# Patient Record
Sex: Male | Born: 1960 | Race: White | Hispanic: No | Marital: Married | Smoking: Never smoker
Health system: Southern US, Community
[De-identification: ages and names within clinical notes are randomized; demographics above are authoritative.]

## PROBLEM LIST (undated history)

## (undated) DIAGNOSIS — G4730 Sleep apnea, unspecified: Secondary | ICD-10-CM

## (undated) DIAGNOSIS — I779 Disorder of arteries and arterioles, unspecified: Secondary | ICD-10-CM

## (undated) DIAGNOSIS — I2699 Other pulmonary embolism without acute cor pulmonale: Secondary | ICD-10-CM

## (undated) DIAGNOSIS — R931 Abnormal findings on diagnostic imaging of heart and coronary circulation: Secondary | ICD-10-CM

## (undated) DIAGNOSIS — K5792 Diverticulitis of intestine, part unspecified, without perforation or abscess without bleeding: Secondary | ICD-10-CM

## (undated) DIAGNOSIS — M109 Gout, unspecified: Secondary | ICD-10-CM

## (undated) DIAGNOSIS — N289 Disorder of kidney and ureter, unspecified: Secondary | ICD-10-CM

## (undated) DIAGNOSIS — I10 Essential (primary) hypertension: Secondary | ICD-10-CM

## (undated) HISTORY — PX: TONSILLECTOMY: SUR1361

## (undated) HISTORY — DX: Abnormal findings on diagnostic imaging of heart and coronary circulation: R93.1

## (undated) HISTORY — DX: Essential (primary) hypertension: I10

## (undated) HISTORY — DX: Disorder of arteries and arterioles, unspecified: I77.9

---

## 2001-05-05 ENCOUNTER — Ambulatory Visit (HOSPITAL_COMMUNITY): Admission: RE | Admit: 2001-05-05 | Discharge: 2001-05-05 | Payer: Self-pay | Admitting: Cardiology

## 2013-06-28 ENCOUNTER — Ambulatory Visit
Admission: RE | Admit: 2013-06-28 | Discharge: 2013-06-28 | Disposition: A | Discharge status: Home / Self Care | Payer: BC Managed Care – PPO | Source: Ambulatory Visit | Attending: Family Medicine | Admitting: Family Medicine

## 2013-06-28 ENCOUNTER — Other Ambulatory Visit: Payer: Self-pay | Admitting: Family Medicine

## 2013-06-28 DIAGNOSIS — R110 Nausea: Secondary | ICD-10-CM

## 2013-06-30 ENCOUNTER — Inpatient Hospital Stay (HOSPITAL_COMMUNITY)
Admission: AD | Admit: 2013-06-30 | Discharge: 2013-07-07 | DRG: 556 | Disposition: A | Discharge status: Home / Self Care | Payer: BC Managed Care – PPO | Source: Ambulatory Visit | Attending: Surgery | Admitting: Surgery

## 2013-06-30 ENCOUNTER — Other Ambulatory Visit: Payer: Self-pay

## 2013-06-30 ENCOUNTER — Other Ambulatory Visit (HOSPITAL_COMMUNITY): Payer: Self-pay | Admitting: Family Medicine

## 2013-06-30 ENCOUNTER — Encounter (INDEPENDENT_AMBULATORY_CARE_PROVIDER_SITE_OTHER): Payer: Self-pay | Admitting: General Surgery

## 2013-06-30 ENCOUNTER — Ambulatory Visit (INDEPENDENT_AMBULATORY_CARE_PROVIDER_SITE_OTHER): Payer: Self-pay | Admitting: General Surgery

## 2013-06-30 ENCOUNTER — Encounter (HOSPITAL_COMMUNITY): Payer: Self-pay | Admitting: Neurology

## 2013-06-30 VITALS — BP 136/82 | HR 80 | Resp 18 | Ht 71.0 in | Wt 237.0 lb

## 2013-06-30 DIAGNOSIS — I2699 Other pulmonary embolism without acute cor pulmonale: Secondary | ICD-10-CM

## 2013-06-30 DIAGNOSIS — K8000 Calculus of gallbladder with acute cholecystitis without obstruction: Principal | ICD-10-CM | POA: Diagnosis present

## 2013-06-30 DIAGNOSIS — G4733 Obstructive sleep apnea (adult) (pediatric): Secondary | ICD-10-CM | POA: Diagnosis present

## 2013-06-30 DIAGNOSIS — K429 Umbilical hernia without obstruction or gangrene: Secondary | ICD-10-CM | POA: Diagnosis present

## 2013-06-30 DIAGNOSIS — K8020 Calculus of gallbladder without cholecystitis without obstruction: Secondary | ICD-10-CM

## 2013-06-30 DIAGNOSIS — R42 Dizziness and giddiness: Secondary | ICD-10-CM | POA: Diagnosis not present

## 2013-06-30 DIAGNOSIS — D72829 Elevated white blood cell count, unspecified: Secondary | ICD-10-CM | POA: Diagnosis present

## 2013-06-30 DIAGNOSIS — N289 Disorder of kidney and ureter, unspecified: Secondary | ICD-10-CM | POA: Diagnosis present

## 2013-06-30 DIAGNOSIS — I951 Orthostatic hypotension: Secondary | ICD-10-CM | POA: Diagnosis not present

## 2013-06-30 DIAGNOSIS — M109 Gout, unspecified: Secondary | ICD-10-CM | POA: Diagnosis present

## 2013-06-30 DIAGNOSIS — K8010 Calculus of gallbladder with chronic cholecystitis without obstruction: Secondary | ICD-10-CM | POA: Diagnosis present

## 2013-06-30 HISTORY — DX: Gout, unspecified: M10.9

## 2013-06-30 HISTORY — DX: Sleep apnea, unspecified: G47.30

## 2013-06-30 HISTORY — DX: Disorder of kidney and ureter, unspecified: N28.9

## 2013-06-30 LAB — COMPREHENSIVE METABOLIC PANEL
ALT: 23 U/L (ref 0–53)
AST: 20 U/L (ref 0–37)
Albumin: 3.5 g/dL (ref 3.5–5.2)
Alkaline Phosphatase: 57 U/L (ref 39–117)
Chloride: 97 mEq/L (ref 96–112)
Potassium: 4.2 mEq/L (ref 3.5–5.1)
Sodium: 135 mEq/L (ref 135–145)
Total Protein: 7.1 g/dL (ref 6.0–8.3)

## 2013-06-30 LAB — CBC WITH DIFFERENTIAL/PLATELET
Basophils Relative: 0 % (ref 0–1)
Eosinophils Absolute: 0.2 10*3/uL (ref 0.0–0.7)
MCH: 30.4 pg (ref 26.0–34.0)
MCHC: 33.6 g/dL (ref 30.0–36.0)
Neutro Abs: 6 10*3/uL (ref 1.7–7.7)
Neutrophils Relative %: 69 % (ref 43–77)
Platelets: 249 10*3/uL (ref 150–400)
RBC: 4.25 MIL/uL (ref 4.22–5.81)

## 2013-06-30 MED ORDER — MORPHINE SULFATE 4 MG/ML IJ SOLN
4.0000 mg | INTRAMUSCULAR | Status: DC | PRN
  Administered 2013-06-30: 4 mg via INTRAVENOUS
  Filled 2013-06-30: qty 1

## 2013-06-30 MED ORDER — KCL IN DEXTROSE-NACL 20-5-0.9 MEQ/L-%-% IV SOLN
INTRAVENOUS | Status: DC
  Administered 2013-06-30 – 2013-07-02 (×4): via INTRAVENOUS
  Administered 2013-07-03: 100 mL via INTRAVENOUS
  Administered 2013-07-04 (×2): via INTRAVENOUS
  Filled 2013-06-30 (×15): qty 1000

## 2013-06-30 MED ORDER — PANTOPRAZOLE SODIUM 40 MG IV SOLR
40.0000 mg | Freq: Every day | INTRAVENOUS | Status: DC
  Administered 2013-06-30 – 2013-07-04 (×5): 40 mg via INTRAVENOUS
  Filled 2013-06-30 (×7): qty 40

## 2013-06-30 MED ORDER — ONDANSETRON HCL 4 MG/2ML IJ SOLN: 4.0000 mg | Freq: Four times a day (QID) | INTRAMUSCULAR | Status: DC | PRN

## 2013-06-30 MED ORDER — CIPROFLOXACIN IN D5W 400 MG/200ML IV SOLN
400.0000 mg | Freq: Two times a day (BID) | INTRAVENOUS | Status: DC
  Administered 2013-06-30 – 2013-07-02 (×4): 400 mg via INTRAVENOUS
  Filled 2013-06-30 (×4): qty 200

## 2013-06-30 MED ORDER — HYDROCODONE-ACETAMINOPHEN 5-325 MG PO TABS
1.0000 | ORAL_TABLET | ORAL | Status: DC | PRN
  Administered 2013-06-30 – 2013-07-02 (×2): 1 via ORAL
  Administered 2013-07-03 – 2013-07-04 (×3): 2 via ORAL
  Administered 2013-07-05: 1 via ORAL
  Filled 2013-06-30 (×2): qty 2
  Filled 2013-06-30: qty 1
  Filled 2013-06-30: qty 2
  Filled 2013-06-30 (×2): qty 1

## 2013-06-30 NOTE — Progress Notes (Addendum)
Spoke with patient regarding cpap, per md rx for RT consult.  Placed pt on nasal cpap at 14cm h2o per home settings.  Pt is tolerating well at this time.  RN aware.  Pt will have wife bring his nasal pillows from home should he still be here tomorrow night.  Sterile water added to max fill line of humidity chamber.

## 2013-06-30 NOTE — H&P (Signed)
Allen Whitney is an 52 y.o. male.   Chief Complaint: abdominal pain HPI: the patient is a 52 year old white male who has been experiencing severe right back pain for the last 6 days. He has had moderate right upper quadrant abdominal pain. He has had nausea with some dry heaves. The pain has been fairly persistent. He has had an ultrasound which showed stones in the gallbladder and some gallbladder wall thickening. It also showed a stone lodged in the neck of the gallbladder. There was no ductal dilatation.  History reviewed. No pertinent past medical history.  Past Surgical History  Procedure Laterality Date  . Tonsillectomy      History reviewed. No pertinent family history. Social History:  reports that he has never smoked. He does not have any smokeless tobacco history on file. He reports that he does not drink alcohol or use illicit drugs.  Allergies: Not on File   (Not in a hospital admission)  No results found for this or any previous visit (from the past 48 hour(s)). US Abdomen Complete  06/28/2013   *RADIOLOGY REPORT*  Clinical Data: Back pain with nausea constipation.  ABDOMEN ULTRASOUND  Technique:  Complete abdominal ultrasound examination was performed including evaluation of the liver, gallbladder, bile ducts, pancreas, kidneys, spleen, IVC, and abdominal aorta.  Comparison: CT scan from yesterday.  Findings:  Gallbladder:  Several wall tiny echogenic foci in the neck of the gallbladder suggests stones measuring in the six - 7 mm size range. Gallbladder wall is mildly thickened, measuring 3-4 mm.  No substantial pericholecystic fluid.  The sonographer reports no sonographic Murphy's sign.  Common Bile Duct:  Poorly visualized secondary to poor acoustic window.  Liver:  Echogenic parenchyma suggest fatty infiltration.  No evidence for intrahepatic biliary duct dilatation.  IVC:  Normal.  Pancreas:  Poorly seen secondary overlying bowel gas.  Spleen:  Probable calcified granuloma  within the parenchyma.  Right kidney:  13.3 cm in long axis.  Normal.  Left kidney:  12.1 cm in long axis.  Normal.  Abdominal Aorta:  Obscured by overlying bowel gas.  IMPRESSION: Gallstones with apparent mild gallbladder wall thickening. If the clinical picture is equivocal for acute cholecystitis, nuclear scintigraphy may prove helpful to further evaluate.   Original Report Authenticated By: Kennith Center, M.D.    Review of Systems  Constitutional: Negative.   HENT: Negative.   Eyes: Negative.   Respiratory: Negative.   Cardiovascular: Negative.   Gastrointestinal: Positive for nausea, vomiting and abdominal pain.  Genitourinary: Negative.   Musculoskeletal: Positive for back pain.  Skin: Negative.   Neurological: Negative.   Endo/Heme/Allergies: Negative.   Psychiatric/Behavioral: Negative.     Blood pressure 136/82, pulse 80, resp. rate 18, height 5\' 11"  (1.803 m), weight 237 lb (107.502 kg). Physical Exam  Constitutional: He is oriented to person, place, and time. He appears well-developed and well-nourished.  HENT:  Head: Normocephalic and atraumatic.  Eyes: Conjunctivae and EOM are normal. Pupils are equal, round, and reactive to light.  Neck: Normal range of motion. Neck supple.  Cardiovascular: Normal rate, regular rhythm and normal heart sounds.   Respiratory: Effort normal and breath sounds normal.  GI:  Moderate tenderness in RUQ. Severe right back pain  Musculoskeletal: Normal range of motion.  Neurological: He is alert and oriented to person, place, and time.  Skin: Skin is warm and dry.  Psychiatric: He has a normal mood and affect. His behavior is normal.     Assessment/Plan The patient appears to have  cholecystitis with cholelithiasis. He has a very ill and uncomfortable appearance and I do not think he would make it very many days before having to go to the emergency department. Because of his pain level I think he should be admitted to the hospital with plans to  remove his gallbladder this weekend. I've discussed with him in detail the risks and benefits of the operation to do this as well as some of the technical aspects and he understands and wishes to proceed. We will admit him to [facility] now  TOTH III,Lael Pilch S 06/30/2013, 3:33 PM

## 2013-06-30 NOTE — Progress Notes (Signed)
Subjective:     Patient ID: El Pile, male   DOB: 10/04/1961, 52 y.o.   MRN: [id number]  HPI The patient appears to have cholecystitis and cholelithiasis. [facility] with plans for surgery tomorrow  Review of Systems     Objective:   Physical Exam     Assessment:     Cholecystitis with cholelithiasis     Plan:     Plan for admission. History and physical was dictated

## 2013-06-30 NOTE — Progress Notes (Signed)
MD notified pt temp 100.6, also addressed pain not relieved by morphine, orders placed.

## 2013-07-01 ENCOUNTER — Observation Stay (HOSPITAL_COMMUNITY): Payer: BC Managed Care – PPO

## 2013-07-01 ENCOUNTER — Encounter (HOSPITAL_COMMUNITY): Admission: AD | Disposition: A | Discharge status: Home / Self Care | Payer: Self-pay | Source: Ambulatory Visit

## 2013-07-01 ENCOUNTER — Encounter (HOSPITAL_COMMUNITY): Payer: Self-pay | Admitting: Anesthesiology

## 2013-07-01 ENCOUNTER — Observation Stay (HOSPITAL_COMMUNITY): Payer: BC Managed Care – PPO | Admitting: Anesthesiology

## 2013-07-01 DIAGNOSIS — K8066 Calculus of gallbladder and bile duct with acute and chronic cholecystitis without obstruction: Secondary | ICD-10-CM

## 2013-07-01 HISTORY — PX: CHOLECYSTECTOMY: SHX55

## 2013-07-01 HISTORY — PX: UMBILICAL HERNIA REPAIR: SHX196

## 2013-07-01 SURGERY — LAPAROSCOPIC CHOLECYSTECTOMY
Anesthesia: General | Site: Abdomen | Wound class: Dirty or Infected

## 2013-07-01 SURGERY — LAPAROSCOPIC CHOLECYSTECTOMY WITH INTRAOPERATIVE CHOLANGIOGRAM
Anesthesia: General

## 2013-07-01 MED ORDER — MORPHINE SULFATE 2 MG/ML IJ SOLN
2.0000 mg | INTRAMUSCULAR | Status: DC | PRN
  Administered 2013-07-01: 4 mg via INTRAVENOUS
  Administered 2013-07-01: 2 mg via INTRAVENOUS
  Administered 2013-07-02: 4 mg via INTRAVENOUS
  Administered 2013-07-03 (×2): 2 mg via INTRAVENOUS
  Filled 2013-07-01 (×2): qty 1
  Filled 2013-07-01: qty 2
  Filled 2013-07-01 (×2): qty 1
  Filled 2013-07-01: qty 2

## 2013-07-01 MED ORDER — LACTATED RINGERS IV SOLN
INTRAVENOUS | Status: DC
Start: 2013-07-01 — End: 2013-07-01
  Administered 2013-07-01: 12:00:00 via INTRAVENOUS

## 2013-07-01 MED ORDER — METHOCARBAMOL 500 MG PO TABS
500.0000 mg | ORAL_TABLET | Freq: Three times a day (TID) | ORAL | Status: DC
  Administered 2013-07-01 – 2013-07-05 (×14): 500 mg via ORAL
  Filled 2013-07-01 (×18): qty 1

## 2013-07-01 MED ORDER — NEOSTIGMINE METHYLSULFATE 1 MG/ML IJ SOLN
INTRAMUSCULAR | Status: DC | PRN
  Administered 2013-07-01: 4 mg via INTRAVENOUS

## 2013-07-01 MED ORDER — ONDANSETRON HCL 4 MG/2ML IJ SOLN
INTRAMUSCULAR | Status: DC | PRN
  Administered 2013-07-01: 4 mg via INTRAVENOUS

## 2013-07-01 MED ORDER — PROPOFOL 10 MG/ML IV BOLUS
INTRAVENOUS | Status: DC | PRN
  Administered 2013-07-01: 200 mg via INTRAVENOUS

## 2013-07-01 MED ORDER — BUPIVACAINE HCL 0.5 % IJ SOLN
INTRAMUSCULAR | Status: DC | PRN
  Administered 2013-07-01: 15 mL

## 2013-07-01 MED ORDER — HYDROMORPHONE HCL PF 1 MG/ML IJ SOLN: 0.2500 mg | INTRAMUSCULAR | Status: DC | PRN

## 2013-07-01 MED ORDER — GLYCOPYRROLATE 0.2 MG/ML IJ SOLN
INTRAMUSCULAR | Status: DC | PRN
  Administered 2013-07-01: .7 mg via INTRAVENOUS

## 2013-07-01 MED ORDER — BUPIVACAINE HCL (PF) 0.5 % IJ SOLN
INTRAMUSCULAR | Status: AC
  Filled 2013-07-01: qty 30

## 2013-07-01 MED ORDER — LABETALOL HCL 5 MG/ML IV SOLN
INTRAVENOUS | Status: DC | PRN
  Administered 2013-07-01: 5 mg via INTRAVENOUS

## 2013-07-01 MED ORDER — PROMETHAZINE HCL 25 MG/ML IJ SOLN
12.5000 mg | INTRAMUSCULAR | Status: DC | PRN
  Administered 2013-07-01: 12.5 mg via INTRAVENOUS

## 2013-07-01 MED ORDER — LIDOCAINE HCL (CARDIAC) 20 MG/ML IV SOLN
INTRAVENOUS | Status: DC | PRN
  Administered 2013-07-01: 100 mg via INTRAVENOUS

## 2013-07-01 MED ORDER — LACTATED RINGERS IR SOLN
Status: DC | PRN
  Administered 2013-07-01: 1

## 2013-07-01 MED ORDER — MIDAZOLAM HCL 5 MG/5ML IJ SOLN
INTRAMUSCULAR | Status: DC | PRN
  Administered 2013-07-01: 2 mg via INTRAVENOUS

## 2013-07-01 MED ORDER — FENTANYL CITRATE 0.05 MG/ML IJ SOLN
INTRAMUSCULAR | Status: DC | PRN
  Administered 2013-07-01: 50 ug via INTRAVENOUS
  Administered 2013-07-01 (×2): 100 ug via INTRAVENOUS

## 2013-07-01 MED ORDER — HYDROMORPHONE HCL PF 1 MG/ML IJ SOLN
INTRAMUSCULAR | Status: DC | PRN
  Administered 2013-07-01 (×2): 0.5 mg via INTRAVENOUS

## 2013-07-01 MED ORDER — LACTATED RINGERS IV SOLN
INTRAVENOUS | Status: DC | PRN
  Administered 2013-07-01: 10:00:00 via INTRAVENOUS

## 2013-07-01 MED ORDER — ROCURONIUM BROMIDE 100 MG/10ML IV SOLN
INTRAVENOUS | Status: DC | PRN
  Administered 2013-07-01: 50 mg via INTRAVENOUS
  Administered 2013-07-01: 10 mg via INTRAVENOUS

## 2013-07-01 MED ORDER — PROMETHAZINE HCL 25 MG/ML IJ SOLN
12.5000 mg | INTRAMUSCULAR | Status: AC | PRN
  Administered 2013-07-01: 12.5 mg via INTRAVENOUS

## 2013-07-01 SURGICAL SUPPLY — 52 items
APL SKNCLS STERI-STRIP NONHPOA (GAUZE/BANDAGES/DRESSINGS) ×2
APPLIER CLIP 5 13 M/L LIGAMAX5 (MISCELLANEOUS) ×3
APPLIER CLIP ROT 13.4 12 LRG (CLIP) ×3
APR CLP LRG 13.4X12 ROT 20 MLT (CLIP) ×2
APR CLP MED LRG 5 ANG JAW (MISCELLANEOUS) ×2
BAG SPEC RTRVL LRG 6X4 10 (ENDOMECHANICALS) ×2
BENZOIN TINCTURE PRP APPL 2/3 (GAUZE/BANDAGES/DRESSINGS) ×3 IMPLANT
CANISTER SUCTION 2500CC (MISCELLANEOUS) ×3 IMPLANT
CHLORAPREP W/TINT 26ML (MISCELLANEOUS) ×3 IMPLANT
CLIP APPLIE 5 13 M/L LIGAMAX5 (MISCELLANEOUS) ×2 IMPLANT
CLIP APPLIE ROT 13.4 12 LRG (CLIP) ×1 IMPLANT
CLOTH BEACON ORANGE TIMEOUT ST (SAFETY) ×3 IMPLANT
COVER MAYO STAND STRL (DRAPES) ×5 IMPLANT
DECANTER SPIKE VIAL GLASS SM (MISCELLANEOUS) ×1 IMPLANT
DISSECTOR BLUNT TIP ENDO 5MM (MISCELLANEOUS) ×2 IMPLANT
DRAIN CHANNEL RND F F (WOUND CARE) ×2 IMPLANT
DRAPE C-ARM 42X120 X-RAY (DRAPES) ×3 IMPLANT
DRAPE LAPAROSCOPIC ABDOMINAL (DRAPES) ×3 IMPLANT
DRAPE UTILITY XL STRL (DRAPES) ×3 IMPLANT
DRSG TEGADERM 2-3/8X2-3/4 SM (GAUZE/BANDAGES/DRESSINGS) ×5 IMPLANT
DRSG TEGADERM 4X4.75 (GAUZE/BANDAGES/DRESSINGS) ×2 IMPLANT
ELECT REM PT RETURN 9FT ADLT (ELECTROSURGICAL) ×3
ELECTRODE REM PT RTRN 9FT ADLT (ELECTROSURGICAL) ×2 IMPLANT
ENDOLOOP SUT PDS II  0 18 (SUTURE) ×1
ENDOLOOP SUT PDS II 0 18 (SUTURE) ×1 IMPLANT
EVACUATOR SILICONE 100CC (DRAIN) ×2 IMPLANT
GAUZE SPONGE 2X2 8PLY STRL LF (GAUZE/BANDAGES/DRESSINGS) ×2 IMPLANT
GLOVE ECLIPSE 8.0 STRL XLNG CF (GLOVE) ×3 IMPLANT
GLOVE INDICATOR 8.0 STRL GRN (GLOVE) ×3 IMPLANT
GOWN STRL REIN XL XLG (GOWN DISPOSABLE) ×9 IMPLANT
HEMOSTAT SNOW SURGICEL 2X4 (HEMOSTASIS) ×4 IMPLANT
KIT BASIN OR (CUSTOM PROCEDURE TRAY) ×3 IMPLANT
NS IRRIG 1000ML POUR BTL (IV SOLUTION) ×2 IMPLANT
PENCIL BUTTON HOLSTER BLD 10FT (ELECTRODE) ×2 IMPLANT
POUCH SPECIMEN RETRIEVAL 10MM (ENDOMECHANICALS) ×3 IMPLANT
SET CHOLANGIOGRAPH MIX (MISCELLANEOUS) ×3 IMPLANT
SET IRRIG TUBING LAPAROSCOPIC (IRRIGATION / IRRIGATOR) ×3 IMPLANT
SLEEVE XCEL OPT CAN 5 100 (ENDOMECHANICALS) ×6 IMPLANT
SOLUTION ANTI FOG 6CC (MISCELLANEOUS) ×3 IMPLANT
SPONGE DRAIN TRACH 4X4 STRL 2S (GAUZE/BANDAGES/DRESSINGS) ×2 IMPLANT
SPONGE GAUZE 2X2 STER 10/PKG (GAUZE/BANDAGES/DRESSINGS) ×1
STRIP CLOSURE SKIN 1/2X4 (GAUZE/BANDAGES/DRESSINGS) ×1 IMPLANT
SUT MNCRL AB 4-0 PS2 18 (SUTURE) ×3 IMPLANT
SUT NOVA NAB DX-16 0-1 5-0 T12 (SUTURE) ×2 IMPLANT
TAPE CLOTH SURG 4X10 WHT LF (GAUZE/BANDAGES/DRESSINGS) ×2 IMPLANT
TOWEL OR 17X26 10 PK STRL BLUE (TOWEL DISPOSABLE) ×5 IMPLANT
TOWEL OR NON WOVEN STRL DISP B (DISPOSABLE) ×3 IMPLANT
TRAY LAP CHOLE (CUSTOM PROCEDURE TRAY) ×3 IMPLANT
TROCAR BLADELESS OPT 5 100 (ENDOMECHANICALS) ×3 IMPLANT
TROCAR XCEL BLUNT TIP 100MML (ENDOMECHANICALS) ×3 IMPLANT
TROCAR XCEL NON-BLD 11X100MML (ENDOMECHANICALS) ×2 IMPLANT
TUBING INSUFFLATION 10FT LAP (TUBING) ×3 IMPLANT

## 2013-07-01 NOTE — Anesthesia Preprocedure Evaluation (Addendum)
Anesthesia Evaluation  Patient identified by MRN, date of birth, ID band Patient awake    Reviewed: Allergy & Precautions, H&P , NPO status , Patient's Chart, lab work & pertinent test results  Airway Mallampati: III TM Distance: >3 FB Neck ROM: full    Dental no notable dental hx. (+) Teeth Intact and Dental Advisory Given   Pulmonary sleep apnea and Continuous Positive Airway Pressure Ventilation ,  breath sounds clear to auscultation  Pulmonary exam normal       Cardiovascular Exercise Tolerance: Good negative cardio ROS  Rhythm:regular Rate:Normal     Neuro/Psych negative neurological ROS  negative psych ROS   GI/Hepatic negative GI ROS, Neg liver ROS,   Endo/Other  negative endocrine ROS  Renal/GU negative Renal ROS  negative genitourinary   Musculoskeletal   Abdominal   Peds  Hematology negative hematology ROS (+)   Anesthesia Other Findings   Reproductive/Obstetrics negative OB ROS                          Anesthesia Physical Anesthesia Plan  ASA: III  Anesthesia Plan: General   Post-op Pain Management:    Induction: Intravenous  Airway Management Planned: Oral ETT  Additional Equipment:   Intra-op Plan:   Post-operative Plan: Extubation in OR  Informed Consent: I have reviewed the patients History and Physical, chart, labs and discussed the procedure including the risks, benefits and alternatives for the proposed anesthesia with the patient or authorized representative who has indicated his/her understanding and acceptance.   Dental Advisory Given  Plan Discussed with: CRNA and Surgeon  Anesthesia Plan Comments:         Anesthesia Quick Evaluation

## 2013-07-01 NOTE — Progress Notes (Signed)
Day of Surgery  Subjective: Still with pain in his back.  Objective: Vital signs in last 24 hours: Temp:  [98.9 F (37.2 C)-100.6 F (38.1 C)] 99 F (37.2 C) (09/06 0602) Pulse Rate:  [77-94] 77 (09/06 0602) Resp:  [18-20] 20 (09/06 0602) BP: (124-175)/(72-89) 149/81 mmHg (09/06 0602) SpO2:  [93 %-97 %] 95 % (09/06 0602) Weight:  [236 lb 15.9 oz (107.5 kg)-237 lb (107.502 kg)] 236 lb 15.9 oz (107.5 kg) (09/05 1700) Last BM Date: 06/30/13  Intake/Output from previous day: 09/05 0701 - 09/06 0700 In: 1675 [I.V.:1275; IV Piggyback:400] Out: -  Intake/Output this shift:    PE: General- In NAD Abdomen-soft, mild RUQ tenderness  Lab Results:   Recent Labs  06/30/13 1724  WBC 8.7  HGB 12.9*  HCT 38.4*  PLT 249   BMET  Recent Labs  06/30/13 1724  NA 135  K 4.2  CL 97  CO2 31  GLUCOSE 101*  BUN 15  CREATININE 1.29  CALCIUM 9.5   PT/INR No results found for this basename: LABPROT, INR,  in the last 72 hours Comprehensive Metabolic Panel:    Component Value Date/Time   NA 135 06/30/2013 1724   K 4.2 06/30/2013 1724   CL 97 06/30/2013 1724   CO2 31 06/30/2013 1724   BUN 15 06/30/2013 1724   CREATININE 1.29 06/30/2013 1724   GLUCOSE 101* 06/30/2013 1724   CALCIUM 9.5 06/30/2013 1724   AST 20 06/30/2013 1724   ALT 23 06/30/2013 1724   ALKPHOS 57 06/30/2013 1724   BILITOT 0.7 06/30/2013 1724   PROT 7.1 06/30/2013 1724   ALBUMIN 3.5 06/30/2013 1724     Studies/Results: No results found.  Anti-infectives: Anti-infectives   Start     Dose/Rate Route Frequency Ordered Stop   06/30/13 1800  ciprofloxacin (CIPRO) IVPB 400 mg     400 mg 200 mL/hr over 60 Minutes Intravenous Every 12 hours 06/30/13 1626        Assessment Principal Problem:   Cholecystitis with cholelithiasis-has been symptomatic for 6 days.    LOS: 1 day   Plan: Laparoscopic possible open cholecystectomy.  Procedure and risks have been explained to him.   Avianna Moynahan J 07/01/2013

## 2013-07-01 NOTE — Op Note (Signed)
Operative Note  Allen Whitney male 52 y.o. 07/01/2013  PREOPERATIVE DX:  Acute cholecystitis, cholelithiasis, umbilical hernia  POSTOPERATIVE DX:  Same with necrotic changes at cystic duct  PROCEDURE:  Laparoscopic cholecystectomy; Umbilical hernia repair         Surgeon: Adolph Pollack   Assistants: Ovidio Kin M.D.  Anesthesia: General endotracheal anesthesia with Marcaine local  Indications: This is a 52 year old male who has had some abdominal pain this progressively increasing as well as nausea for 6 days now. He was seen in the office yesterday and admitted last night and placed on IV antibiotics. He is now brought to the operating room for the above procedure. The procedure and risks have been discussed with him.    Procedure Detail:  He was brought to the operating room placed supine on the operating table and general anesthetic was given. The abdominal wall hair was clipped and the abdominal wall was widely prepped and draped. A timeout was performed.  The umbilical hernia was reduced. Local anesthetic was infiltrated In the subumbilical subcutaneous tissues. A transverse subumbilical incision was made through the skin and subcutaneous tissue. The hernia defect was identified. It was containing extraperitoneal fat. The sac was entered gaining access to the peritoneal cavity. Stay sutures of 0 Vicryl placed on the lateral edges of the hernia fascia. A Hassan trocar is placed into the abdominal cavity. A pneumoperitoneum was created.  The laparoscope was introduced and there was no evidence of underlying organ injury or bleeding. He was placed in reverse Trendelenburg position with the right side tilted slightly up. The omentum was adherent to the fundus of the gallbladder. A 5 mm trocar was placed through an epigastric incision. A 5 mm trocar is placed a lateral right upper quadrant and one was placed in the medial right upper quadrant. Using blunt dissection, I peeled the  omentum off the gallbladder. An inflamed tense gallbladder was encountered. Needle decompression was performed draining white and purulent appearing bile. The fundus was then grasped and retracted toward the shoulder. Dense adhesions between the omentum and gallbladder were bluntly dissected. There were adhesions between the duodenum and gallbladder and Hydro- dissection was performed along with careful blunt dissection to separate these.  Using careful blunt dissection I mobilized the infundibulum. I then identified the cystic duct which was dark and necrotic appearing. The anterior branch of the cystic artery was also identified. A window was created around the anterior branch of the cystic artery was clipped and divided. This allowed for the critical view to be achieved. As the infundibulum was then retracted laterally the cystic duct began to avulse from it.  A cholangiocatheter was then placed through the anterior abdominal wall. When placing it into the cystic duct, because the cystic duct was so necrotic, it went directly out the back wall without much pressure. Because I had the anatomy well defined, his LFTs were normal, and he had a stone obstructing the neck of the gallbladder, I decided to abort the cholangiogram. I placed 2 clips on the cystic duct and divided the remaining part. A PDS Endoloop was also placed on the cystic duct. The cystic duct was necrotic as far as I could see.  A posterior branch of the cystic artery was identified. It was isolated clipped and divided. Using electrocautery, the gallbladder was dissected free from the liver. One stone spilled out but was evacuated. The gallbladder was placed in a retrieval bag and placed in the right upper quadrant.  The gallbladder fossa was  copiously irrigated. Bleeding areas were controlled with electrocautery. Surgicel was placed in the gallbladder fossa.  A size 19 Blake drain was then placed into the abdominal cavity and positioned in the  gallbladder fossa. Part of the drain was brought out through the lateral 5 mm trocar incision and anchored to the skin with 3-0 nylon suture.  [facility] trocar was then removed. The umbilical hernia was primarily repaired with interrupted #1 Novafil sutures. Some extraperitoneal fat was dissected from it before the repair. I did a laparoscopy and inspected the umbilical hernia repair and it was solid. Marcaine was injected into the fascia around the umbilical hernia repair. The remaining trochars were removed and pneumoperitoneum was released the remaining skin incisions were closed with 4-0 Monocryl subcuticular stitches. Steri-Strips and sterile dressings were applied. The drain was hooked up to closed suction.  He tolerated the procedure well without apparent complications and was taken to the recovery room in satisfactory condition.  Estimated Blood Loss:  300 mL         Drains: #19 Blake  Blood Given: none          Specimens: Gallbladder        Complications:  * No complications entered in OR log *         Disposition: PACU - hemodynamically stable.         Condition: stable

## 2013-07-01 NOTE — Transfer of Care (Signed)
Immediate Anesthesia Transfer of Care Note  Patient: Allen Whitney  Procedure(s) Performed: Procedure(s): LAPAROSCOPIC CHOLECYSTECTOMY (N/A) HERNIA REPAIR UMBILICAL ADULT (N/A)  Patient Location: PACU  Anesthesia Type:General  Level of Consciousness: awake, alert  and oriented  Airway & Oxygen Therapy: Patient Spontanous Breathing and Patient connected to face mask oxygen  Post-op Assessment: Report given to PACU RN and Post -op Vital signs reviewed and stable  Post vital signs: Reviewed and stable  Complications: No apparent anesthesia complications

## 2013-07-01 NOTE — Preoperative (Signed)
Beta Blockers   Reason not to administer Beta Blockers:Not Applicable 

## 2013-07-01 NOTE — Anesthesia Postprocedure Evaluation (Signed)
  Anesthesia Post-op Note  Patient: Allen Whitney  Procedure(s) Performed: Procedure(s) (LRB): LAPAROSCOPIC CHOLECYSTECTOMY (N/A) HERNIA REPAIR UMBILICAL ADULT (N/A)  Patient Location: PACU  Anesthesia Type: General  Level of Consciousness: awake and alert   Airway and Oxygen Therapy: Patient Spontanous Breathing  Post-op Pain: mild  Post-op Assessment: Post-op Vital signs reviewed, Patient's Cardiovascular Status Stable, Respiratory Function Stable, Patent Airway and No signs of Nausea or vomiting  Last Vitals:  Filed Vitals:   07/01/13 0602  BP: 149/81  Pulse: 77  Temp: 37.2 C  Resp: 20    Post-op Vital Signs: stable   Complications: No apparent anesthesia complications

## 2013-07-02 LAB — GLUCOSE, CAPILLARY: Glucose-Capillary: 133 mg/dL — ABNORMAL HIGH (ref 70–99)

## 2013-07-02 MED ORDER — PIPERACILLIN-TAZOBACTAM 3.375 G IVPB
3.3750 g | Freq: Three times a day (TID) | INTRAVENOUS | Status: DC
  Administered 2013-07-02 – 2013-07-07 (×15): 3.375 g via INTRAVENOUS
  Filled 2013-07-02 (×16): qty 50

## 2013-07-02 NOTE — Progress Notes (Signed)
1 Day Post-Op  Subjective: Very sore.  We discussed the findings at surgery.  Objective: Vital signs in last 24 hours: Temp:  [98.9 F (37.2 C)-101.4 F (38.6 C)] 99.4 F (37.4 C) (09/07 0915) Pulse Rate:  [75-97] 92 (09/07 0915) Resp:  [12-20] 20 (09/07 0915) BP: (129-159)/(70-86) 140/84 mmHg (09/07 0915) SpO2:  [95 %-100 %] 96 % (09/07 0915) Last BM Date: 07/01/13  Intake/Output from previous day: 09/06 0701 - 09/07 0700 In: 1531.7 [I.V.:1531.7] Out: 1230 [Urine:1080; Drains:100; Blood:50] Intake/Output this shift: Total I/O In: -  Out: 550 [Urine:550]  PE: General- In NAD Abdomen-soft, tender at incision sites, serosanguinous drain output  Lab Results:   Recent Labs  06/30/13 1724  WBC 8.7  HGB 12.9*  HCT 38.4*  PLT 249   BMET  Recent Labs  06/30/13 1724  NA 135  K 4.2  CL 97  CO2 31  GLUCOSE 101*  BUN 15  CREATININE 1.29  CALCIUM 9.5   PT/INR No results found for this basename: LABPROT, INR,  in the last 72 hours Comprehensive Metabolic Panel:    Component Value Date/Time   NA 135 06/30/2013 1724   K 4.2 06/30/2013 1724   CL 97 06/30/2013 1724   CO2 31 06/30/2013 1724   BUN 15 06/30/2013 1724   CREATININE 1.29 06/30/2013 1724   GLUCOSE 101* 06/30/2013 1724   CALCIUM 9.5 06/30/2013 1724   AST 20 06/30/2013 1724   ALT 23 06/30/2013 1724   ALKPHOS 57 06/30/2013 1724   BILITOT 0.7 06/30/2013 1724   PROT 7.1 06/30/2013 1724   ALBUMIN 3.5 06/30/2013 1724     Studies/Results: Dg Chest 2 View  07/01/2013   CLINICAL DATA:  Abdominal pain. Preoperative.  EXAM: CHEST  2 VIEW  COMPARISON:  06/27/2013  FINDINGS: Low lung volumes are present, causing crowding of the pulmonary vasculature. Subsegmental atelectasis along the left hemidiaphragm. The lungs appear otherwise clear. The small nodule shown on prior chest CT is not visible, although prior recommendations for followup still apply.  Mild atherosclerotic calcification of the aortic arch.  IMPRESSION: 1. Low lung volumes.  Subsegmental atelectasis along the left hemidiaphragm. 2. Mild atherosclerosis.   Electronically Signed   By: Herbie Baltimore   On: 07/01/2013 08:35    Anti-infectives: Anti-infectives   Start     Dose/Rate Route Frequency Ordered Stop   06/30/13 1800  ciprofloxacin (CIPRO) IVPB 400 mg     400 mg 200 mL/hr over 60 Minutes Intravenous Every 12 hours 06/30/13 1626        Assessment Principal Problem:   Cholecystitis with cholelithiasis and necrotic cystic duct s/p lap chole 9/8-febrile overnight.    LOS: 2 days   Plan: D/C Cipro and start Zosyn.  Mobilize.  Check lab tomorrow.   Jaicob Dia J 07/02/2013

## 2013-07-02 NOTE — Progress Notes (Addendum)
Patient had a vagal episode where he was sitting in the chair and passed out RN was called to the room. Patient was lethargic.MD was called twice with no call back. Reported to Press photographer. VS stable. CBG 133. Wife at the bedside. Patient feels normal now.

## 2013-07-02 NOTE — Progress Notes (Addendum)
This shift pt's  Temp at steady increase 99.0 to 100.7. MD made aware , no new orders given. Will continue to monitor pt and intervene as appropriate.

## 2013-07-03 ENCOUNTER — Encounter (HOSPITAL_COMMUNITY): Payer: Self-pay | Admitting: General Surgery

## 2013-07-03 LAB — COMPREHENSIVE METABOLIC PANEL
Albumin: 2.8 g/dL — ABNORMAL LOW (ref 3.5–5.2)
BUN: 14 mg/dL (ref 6–23)
Calcium: 9.2 mg/dL (ref 8.4–10.5)
Creatinine, Ser: 1.48 mg/dL — ABNORMAL HIGH (ref 0.50–1.35)
GFR calc Af Amer: 61 mL/min — ABNORMAL LOW (ref >90)
Glucose, Bld: 133 mg/dL — ABNORMAL HIGH (ref 70–99)
Potassium: 4.4 mEq/L (ref 3.5–5.1)
Total Protein: 6.7 g/dL (ref 6.0–8.3)

## 2013-07-03 LAB — CBC
HCT: 36.4 % — ABNORMAL LOW (ref 39.0–52.0)
Hemoglobin: 12.3 g/dL — ABNORMAL LOW (ref 13.0–17.0)
MCV: 91.7 fL (ref 78.0–100.0)
RBC: 3.97 MIL/uL — ABNORMAL LOW (ref 4.22–5.81)
RDW: 14 % (ref 11.5–15.5)
WBC: 14.2 10*3/uL — ABNORMAL HIGH (ref 4.0–10.5)

## 2013-07-03 NOTE — Progress Notes (Signed)
Says he feels dizzy standing, thinks he's pushing it.   Will check orthostatics tonight and in AM.

## 2013-07-03 NOTE — Progress Notes (Signed)
2 Days Post-Op   Assessment: s/p Procedure(s): LAPAROSCOPIC CHOLECYSTECTOMY HERNIA REPAIR UMBILICAL ADULT Patient Active Problem List   Diagnosis Date Noted  . Cholecystitis with cholelithiasis 06/30/2013    Priority: High    Improved somewhat, still with elevated wbc, now on zosyn.  Plan: Contnue current regime, encouraged ambulation will not be able to go home yet, maybe tomorrow  Subjective: Feels a bit better this am, pain control ok, doesn't feel short of breath, got light headed yesterday when up, but feels better this morning.   Objective: Vital signs in last 24 hours: Temp:  [98.3 F (36.8 C)-99.4 F (37.4 C)] 98.3 F (36.8 C) (09/08 0600) Pulse Rate:  [92-107] 96 (09/08 0600) Resp:  [20-24] 24 (09/08 0600) BP: (124-140)/(73-85) 124/73 mmHg (09/08 0600) SpO2:  [92 %-96 %] 92 % (09/08 0600)   Intake/Output from previous day: 09/07 0701 - 09/08 0700 In: 2881.7 [P.O.:120; I.V.:2601.7; IV Piggyback:100] Out: 850 [Urine:850]  General appearance: alert, cooperative, fatigued and no distress Resp: clear to auscultation bilaterally GI: Slightly distended, soft, not tender, few BS present  Incision: healing well, Drain without bile, slowing down overnight  Lab Results:   Recent Labs  06/30/13 1724 07/03/13 0430  WBC 8.7 14.2*  HGB 12.9* 12.3*  HCT 38.4* 36.4*  PLT 249 202   BMET  Recent Labs  06/30/13 1724 07/03/13 0430  NA 135 136  K 4.2 4.4  CL 97 99  CO2 31 29  GLUCOSE 101* 133*  BUN 15 14  CREATININE 1.29 1.48*  CALCIUM 9.5 9.2    [facility], Scheduled . methocarbamol  500 mg Oral TID  . pantoprazole (PROTONIX) IV  40 mg Intravenous QHS  . piperacillin-tazobactam (ZOSYN)  IV  3.375 g Intravenous Q8H    Studies/Results: No results found.    LOS: 3 days     Currie Paris, MD, [facility], [place] [phone]   07/03/2013 7:55 AM

## 2013-07-03 NOTE — Progress Notes (Signed)
Placed pt on his home cpap unit. Vitals stable RN notified of pt on mach.

## 2013-07-03 NOTE — Progress Notes (Signed)
Patient has been using his incentive spirometry regularly and sitting in chair.  Has not experienced any spells as he did yesterday.  Encouraged to take medication as needed for pain.  Family in with patient visiting.

## 2013-07-04 ENCOUNTER — Encounter (HOSPITAL_COMMUNITY): Admission: RE | Admit: 2013-07-04 | Payer: BC Managed Care – PPO | Source: Ambulatory Visit | Admitting: General Surgery

## 2013-07-04 LAB — CBC
HCT: 34.9 % — ABNORMAL LOW (ref 39.0–52.0)
Hemoglobin: 11.5 g/dL — ABNORMAL LOW (ref 13.0–17.0)
MCH: 30.2 pg (ref 26.0–34.0)
MCHC: 33 g/dL (ref 30.0–36.0)
MCV: 91.6 fL (ref 78.0–100.0)
RBC: 3.81 MIL/uL — ABNORMAL LOW (ref 4.22–5.81)

## 2013-07-04 LAB — BASIC METABOLIC PANEL
BUN: 16 mg/dL (ref 6–23)
CO2: 29 mEq/L (ref 19–32)
Calcium: 9 mg/dL (ref 8.4–10.5)
GFR calc non Af Amer: 53 mL/min — ABNORMAL LOW (ref >90)
Glucose, Bld: 116 mg/dL — ABNORMAL HIGH (ref 70–99)
Sodium: 136 mEq/L (ref 135–145)

## 2013-07-04 MED ORDER — ENOXAPARIN SODIUM 40 MG/0.4ML ~~LOC~~ SOLN
40.0000 mg | SUBCUTANEOUS | Status: DC
  Administered 2013-07-04: 40 mg via SUBCUTANEOUS
  Filled 2013-07-04 (×3): qty 0.4

## 2013-07-04 MED ORDER — SODIUM CHLORIDE 0.9 % IV BOLUS (SEPSIS)
1000.0000 mL | Freq: Once | INTRAVENOUS | Status: AC
  Administered 2013-07-04: 1000 mL via INTRAVENOUS

## 2013-07-04 NOTE — Progress Notes (Signed)
Agree with plan 

## 2013-07-04 NOTE — Progress Notes (Signed)
Walking in hallway with family, tolerating well.

## 2013-07-04 NOTE — Progress Notes (Signed)
Patient instructed on how to empty JP and recharge since he will be discharged home with drain in place.  Abdominal incisions left undressed/ steri strips in place.

## 2013-07-04 NOTE — Progress Notes (Signed)
Agree with A&P of WJ,PA. He appears better than yesterday. Expect additional IVF to help. Must have been more volume depleted than we thought.

## 2013-07-04 NOTE — Progress Notes (Signed)
3 Days Post-Op  Subjective: Feels a little better, Not overly tender, less dizzy standing.  He was sick for a week prior to admit.  He has his CPAP with him today, has been treated for this for 2 years, hx of low energy prior.  Objective: Vital signs in last 24 hours: Temp:  [98.3 F (36.8 C)-99.4 F (37.4 C)] 98.3 F (36.8 C) (09/09 0705) Pulse Rate:  [87-110] 94 (09/09 0705) Resp:  [20-22] 20 (09/09 0705) BP: (105-146)/(66-93) 118/71 mmHg (09/09 0705) SpO2:  [90 %-94 %] 94 % (09/09 0705) Last BM Date: 07/03/13 PO not recorded, Diet: full liquids started at supper last PM Afebrile, he did drop BP standing this AM 131/75 lying  HR 87                                                                     136/66 sitting  HR 93                                                                      118/71 standing  HR 94 Creatinine is up from admission, WBC is down  Intake/Output from previous day:           09/08 0701 - 09/09 0700 In: 4323.3 [I.V.:4123.3; IV Piggyback:200] Out: 230 [Urine:200; Drains:30] Intake/Output this shift:    General appearance: alert, cooperative and no distress Resp: clear to auscultation bilaterally GI: soft, sore, but normal post op.  all his sites look fine.  Drain is old blood/serous.  No distension, +BM yesterday.  Lab Results:   Recent Labs  07/03/13 0430 07/04/13 0435  WBC 14.2* 10.3  HGB 12.3* 11.5*  HCT 36.4* 34.9*  PLT 202 218    BMET  Recent Labs  07/03/13 0430 07/04/13 0435  NA 136 136  K 4.4 4.3  CL 99 101  CO2 29 29  GLUCOSE 133* 116*  BUN 14 16  CREATININE 1.48* 1.47*  CALCIUM 9.2 9.0   PT/INR No results found for this basename: LABPROT, INR,  in the last 72 hours   Recent Labs Lab 06/30/13 1724 07/03/13 0430  AST 20 31  ALT 23 38  ALKPHOS 57 62  BILITOT 0.7 0.8  PROT 7.1 6.7  ALBUMIN 3.5 2.8*     Lipase  No results found for this basename: lipase     Studies/Results: No results found.  Medications: .  methocarbamol  500 mg Oral TID  . pantoprazole (PROTONIX) IV  40 mg Intravenous QHS  . piperacillin-tazobactam (ZOSYN)  IV  3.375 g Intravenous Q8H    Assessment/Plan Acute cholecystitis, cholelithiasis, umbilical hernia with necrotic changes at the cystic duct. S/p Laparoscopic cholecystectomy; Umbilical hernia repair, 07/01/2013, Adolph Pollack, MD OSA on CPAP Renal Insuffiency Add Lovenox for DVT  Plan:  If he does well with breakfast, I am going to advance his diet at lunch.  Continue hydration, with NS bolus, add Lovenox, recheck labs in Am.  Increase ambulation.      LOS: 4  days    Asherah Lavoy 07/04/2013

## 2013-07-05 ENCOUNTER — Inpatient Hospital Stay (HOSPITAL_COMMUNITY): Payer: BC Managed Care – PPO

## 2013-07-05 ENCOUNTER — Encounter (HOSPITAL_COMMUNITY): Payer: Self-pay | Admitting: Radiology

## 2013-07-05 DIAGNOSIS — M109 Gout, unspecified: Secondary | ICD-10-CM

## 2013-07-05 DIAGNOSIS — R0602 Shortness of breath: Secondary | ICD-10-CM

## 2013-07-05 DIAGNOSIS — I2699 Other pulmonary embolism without acute cor pulmonale: Secondary | ICD-10-CM

## 2013-07-05 DIAGNOSIS — K8010 Calculus of gallbladder with chronic cholecystitis without obstruction: Secondary | ICD-10-CM

## 2013-07-05 LAB — BASIC METABOLIC PANEL
CO2: 26 mEq/L (ref 19–32)
Calcium: 8.7 mg/dL (ref 8.4–10.5)
Creatinine, Ser: 1.39 mg/dL — ABNORMAL HIGH (ref 0.50–1.35)
GFR calc non Af Amer: 56 mL/min — ABNORMAL LOW (ref >90)
Glucose, Bld: 105 mg/dL — ABNORMAL HIGH (ref 70–99)
Sodium: 136 mEq/L (ref 135–145)

## 2013-07-05 LAB — CBC
MCH: 30.7 pg (ref 26.0–34.0)
MCV: 90.6 fL (ref 78.0–100.0)
Platelets: 215 10*3/uL (ref 150–400)
RBC: 3.52 MIL/uL — ABNORMAL LOW (ref 4.22–5.81)
RDW: 14 % (ref 11.5–15.5)
WBC: 8.6 10*3/uL (ref 4.0–10.5)

## 2013-07-05 LAB — URIC ACID: Uric Acid, Serum: 5.2 mg/dL (ref 4.0–7.8)

## 2013-07-05 MED ORDER — SODIUM CHLORIDE 0.9 % IV SOLN
INTRAVENOUS | Status: DC
  Administered 2013-07-05 – 2013-07-06 (×2): via INTRAVENOUS

## 2013-07-05 MED ORDER — HEPARIN BOLUS VIA INFUSION
2800.0000 [IU] | Freq: Once | INTRAVENOUS | Status: AC
  Administered 2013-07-05: 2800 [IU] via INTRAVENOUS
  Filled 2013-07-05: qty 2800

## 2013-07-05 MED ORDER — HEPARIN BOLUS VIA INFUSION
3000.0000 [IU] | Freq: Once | INTRAVENOUS | Status: AC
  Administered 2013-07-05: 3000 [IU] via INTRAVENOUS
  Filled 2013-07-05: qty 3000

## 2013-07-05 MED ORDER — COLCHICINE 0.6 MG PO TABS
0.6000 mg | ORAL_TABLET | Freq: Two times a day (BID) | ORAL | Status: DC
  Administered 2013-07-05 – 2013-07-06 (×4): 0.6 mg via ORAL
  Filled 2013-07-05 (×7): qty 1

## 2013-07-05 MED ORDER — TECHNETIUM TO 99M ALBUMIN AGGREGATED
6.4000 | Freq: Once | INTRAVENOUS | Status: AC | PRN
  Administered 2013-07-05: 6.4 via INTRAVENOUS

## 2013-07-05 MED ORDER — HEPARIN (PORCINE) IN NACL 100-0.45 UNIT/ML-% IJ SOLN
2350.0000 [IU]/h | INTRAMUSCULAR | Status: DC
  Administered 2013-07-06 (×2): 2350 [IU]/h via INTRAVENOUS
  Filled 2013-07-05 (×4): qty 250

## 2013-07-05 MED ORDER — TECHNETIUM TC 99M DIETHYLENETRIAME-PENTAACETIC ACID: 44.0000 | Freq: Once | INTRAVENOUS | Status: AC | PRN

## 2013-07-05 MED ORDER — HEPARIN (PORCINE) IN NACL 100-0.45 UNIT/ML-% IJ SOLN
1700.0000 [IU]/h | INTRAMUSCULAR | Status: DC
  Administered 2013-07-05: 1700 [IU]/h via INTRAVENOUS
  Filled 2013-07-05 (×2): qty 250

## 2013-07-05 NOTE — Progress Notes (Signed)
Agree with new plan for likely PE

## 2013-07-05 NOTE — Progress Notes (Signed)
PHARMACY BRIEF NOTE -- Anticoagulation  Consult for:  IV Heparin Indication:  Acute PE  With infusion of 1700 units/hr, the Heparin level drawn at 20:04 tonight was reported as 0.14 units/ml.  This level is below the therapeutic range, 0.3-0.7 units/ml.  The patient's nurse is not aware of any interruption in the infusion or any bleeding problems.  Plan:  Bolus dose 3000 units  Rate increase to 2350 units/hr  Recheck Heparin level 6 hours after changing the rate.  Polo Riley R.Ph. 07/05/2013 10:19 PM

## 2013-07-05 NOTE — Consult Note (Signed)
[facility] Medical Consultation  Allen Whitney WUJ:[id number] DOB: 08-25-1961 DOA: 06/30/2013 PCP: Daisy Floro, MD   Requesting physician: CCS, Zola Button Date of consultation: 9/10 Reason for consultation:pulmonary embolism  Impression/Recommendations  1. Pulmonary embolism -VQ scan with high probability -check lower ext venous Dopplers  -Agree with IV heparin for now -I will transition over to Coumadin or Xarelto in the next 24-48 hours, pt and wife leaning towards xarelto -will need anticoagulation for 3-6 months atleast. -Case manager consult to review cost/co-pay with Xarelto  2. S/p Lap chole with drain -per CCS  3. Mild gout flare  -L foot MTP joint -continue colchicine, FU uric acid -may need allopurinol at discharge.   I will followup again tomorrow. Please contact me if I can be of assistance in the meanwhile. Thank you for this consultation.  [name], MD [phone]  Chief Complaint: PE  HPI:  Allen Whitney is a 51/M without significant PMH was admitted on 9/5 with acute cholecystitis and underwent Lap chole and umbilical hernia repair on 9/6. Pt noticed dyspnea with pleuritic R sided back/abd pain for 7-10 days now, then was diagnosed with acute cholecystitis. Through this continued to have some dyspnea and dizziness worse over the past couple of days. Today underwent VQ scan which showed high probability of PE and hence TR[facility] consult requested.   Review of Systems:  12 system review completed and negative except for dyspnea and pleuritic chest pain and dizziness with bending  Past Medical History  Diagnosis Date  . Sleep apnea     uses CPAP at night   Past Surgical History  Procedure Laterality Date  . Tonsillectomy    . Cholecystectomy N/A 07/01/2013    Procedure: LAPAROSCOPIC CHOLECYSTECTOMY;  Surgeon: Adolph Pollack, MD;  Location: [facility];  Service: General;  Laterality: N/A;  . Umbilical hernia repair N/A 07/01/2013    Procedure:  HERNIA REPAIR UMBILICAL ADULT;  Surgeon: Adolph Pollack, MD;  Location: [facility];  Service: General;  Laterality: N/A;   Social History:  reports that he has never smoked. He does not have any smokeless tobacco history on file. He reports that he does not drink alcohol or use illicit drugs.  No Known Allergies History reviewed. No pertinent family history.  Prior to Admission medications   Medication Sig Start Date End Date Taking? Authorizing Provider  HYDROcodone-acetaminophen (NORCO/VICODIN) 5-325 MG per tablet Take 1 tablet by mouth every 6 (six) hours as needed for pain.   Yes Historical Provider, MD  ibuprofen (ADVIL,MOTRIN) 200 MG tablet Take 200 mg by mouth every 6 (six) hours as needed for pain.   Yes Historical Provider, MD  ondansetron (ZOFRAN) 4 MG tablet Take 4 mg by mouth every 8 (eight) hours as needed for nausea.   Yes Historical Provider, MD  methocarbamol (ROBAXIN) 500 MG tablet Take 500 mg by mouth 3 (three) times daily.    Historical Provider, MD  Multiple Vitamins-Minerals (MULTIVITAMIN WITH MINERALS) tablet Take 1 tablet by mouth daily.    Historical Provider, MD   Physical Exam: Blood pressure 147/74, pulse 86, temperature 98.7 F (37.1 C), temperature source Oral, resp. rate 18, height 5\' 11"  (1.803 m), weight 107.5 kg (236 lb 15.9 oz), SpO2 93.00%. Filed Vitals:   07/05/13 0637  BP: 147/74  Pulse: 86  Temp: 98.7 F (37.1 C)  Resp: 18     General: AAOx3  HEENT: PERRLA, EOMI  Cardiovascular: S1S2/RRR  Respiratory: CTAB, diminished BS at bases  Abdomen: soft, Mild tenderness, Drain noted  Skin: no rashes  Musculoskeletal: mild Swelling and pain in L foot first MTP joint, no edema c/c  Psychiatric: appropriate mood and affect  Neurologic: non focal  Labs on Admission:  Basic Metabolic Panel:  Recent Labs Lab 06/30/13 1724 07/03/13 0430 07/04/13 0435 07/05/13 0440  NA 135 136 136 136  K 4.2 4.4 4.3 4.3  CL 97 99 101 103  CO2 31 29 29 26    GLUCOSE 101* 133* 116* 105*  BUN 15 14 16 18   CREATININE 1.29 1.48* 1.47* 1.39*  CALCIUM 9.5 9.2 9.0 8.7   Liver Function Tests:  Recent Labs Lab 06/30/13 1724 07/03/13 0430  AST 20 31  ALT 23 38  ALKPHOS 57 62  BILITOT 0.7 0.8  PROT 7.1 6.7  ALBUMIN 3.5 2.8*   No results found for this basename: LIPASE, AMYLASE,  in the last 168 hours No results found for this basename: AMMONIA,  in the last 168 hours CBC:  Recent Labs Lab 06/30/13 1724 07/03/13 0430 07/04/13 0435 07/05/13 0440  WBC 8.7 14.2* 10.3 8.6  NEUTROABS 6.0  --   --   --   HGB 12.9* 12.3* 11.5* 10.8*  HCT 38.4* 36.4* 34.9* 31.9*  MCV 90.4 91.7 91.6 90.6  PLT 249 202 218 215   Cardiac Enzymes: No results found for this basename: CKTOTAL, CKMB, CKMBINDEX, TROPONINI,  in the last 168 hours BNP: No components found with this basename: POCBNP,  CBG:  Recent Labs Lab 07/02/13 1641  GLUCAP 133*    Radiological Exams on Admission: Dg Chest 2 View  07/05/2013   CLINICAL DATA:  52 year old male with shortness of Breath status post recent cholecystectomy.  EXAM: CHEST  2 VIEW  COMPARISON:  07/01/2013.  FINDINGS: Stable lung volumes with mild elevation of the right hemidiaphragm. Cardiac size is stable at the upper limits of normal. Other mediastinal contours are within normal limits. Visualized tracheal air column is within normal limits. No pneumothorax or pulmonary edema. Trace bilateral pleural effusions. No confluent pulmonary opacity. Right upper quadrant surgical clips and possible drain in place.  IMPRESSION: Trace pleural effusions. No other acute cardiopulmonary abnormality.   Electronically Signed   By: Augusto Gamble M.D.   On: 07/05/2013 12:12   Nm Pulmonary Perf And Vent  07/05/2013   CLINICAL DATA:  52 year old male with shortness of breast status post recent abdominal surgery.  EXAM: NUCLEAR MEDICINE VENTILATION - PERFUSION LUNG SCAN  TECHNIQUE: Ventilation images were obtained in multiple projections  using inhaled aerosol technetium 99 M DTPA. Perfusion images were obtained in multiple projections after intravenous injection of Tc-59m MAA.  COMPARISON:  Chest radiographs 1120 hr the same day.  RADIOPHARMACEUTICALS:  44.0 mCi Tc-55m DTPA aerosol and 6.4 mCi Tc-89m MAA  FINDINGS: Ventilation: Good quality ventilation study.  No ventilation defect.  Perfusion: Multiple bilateral perihilar perfusion defects , wedge-shaped and extending into the periphery of the lungs.  IMPRESSION: High probability of acute pulmonary embolus.  Study discussed by telephone with Dr. Marlyne Beards on 07/05/2013 at 12:19 .   Electronically Signed   By: Augusto Gamble M.D.   On: 07/05/2013 12:33     Time spent:  [facility] Pager [phone]  If 7PM-7AM, please contact night-coverage [email or web address] Password [email or web address] 07/05/2013, 1:43 PM

## 2013-07-05 NOTE — Progress Notes (Signed)
Patient seen with WJ,PA and agree with plans. I don't think he has PE, but need to be sure.

## 2013-07-05 NOTE — Progress Notes (Signed)
Arrived to pt bedside to offer assistance with cpap.  Pt has his home cpap unit with nasal pillows.  Pt stated he will place it on himself later when ready.  Sterile water added to humidity chamber per request.  Pt was advised that RT is available all night should he need further assistance.

## 2013-07-05 NOTE — Progress Notes (Addendum)
4 Days Post-Op  Subjective: Abdomen feels tight, but better.  He is tolerating low fat diet, with BM x 2. Still a little SOB bending over and sitting.  Also complaining of pain right great toe, with a hx of gout.  Objective: Vital signs in last 24 hours: Temp:  [97.9 F (36.6 C)-99.6 F (37.6 C)] 98.7 F (37.1 C) (09/10 0637) Pulse Rate:  [86-89] 86 (09/10 0637) Resp:  [18] 18 (09/10 0637) BP: (141-148)/(74-90) 147/74 mmHg (09/10 0637) SpO2:  [92 %-95 %] 93 % (09/10 0637) Last BM Date: 07/05/13 PO?  600 recorded this AM, No BM recorded yesterday, no urine recorded yesterday. 50 ml from drain TM 99.6, Tachycardia is better, BP is up some compared to last 48 hours Creatinine is still elevated, but improving Intake/Output from previous day: 09/09 0701 - 09/10 0700 In: 550 [I.V.:500; IV Piggyback:50] Out: 50 [Drains:50] Intake/Output this shift: Total I/O In: 600 [P.O.:600] Out: -   General appearance: alert, cooperative and no distress Resp: clear to auscultation bilaterally GI: soft, sore, drainage is clear serous, incisions all look fine, no distension. Extremities: Both calves measure 45 CM at a point 26 cm above the lat. malleolus.  Lab Results:   Recent Labs  07/04/13 0435 07/05/13 0440  WBC 10.3 8.6  HGB 11.5* 10.8*  HCT 34.9* 31.9*  PLT 218 215    BMET  Recent Labs  07/04/13 0435 07/05/13 0440  NA 136 136  K 4.3 4.3  CL 101 103  CO2 29 26  GLUCOSE 116* 105*  BUN 16 18  CREATININE 1.47* 1.39*  CALCIUM 9.0 8.7   PT/INR No results found for this basename: LABPROT, INR,  in the last 72 hours   Recent Labs Lab 06/30/13 1724 07/03/13 0430  AST 20 31  ALT 23 38  ALKPHOS 57 62  BILITOT 0.7 0.8  PROT 7.1 6.7  ALBUMIN 3.5 2.8*     Lipase  No results found for this basename: lipase     Studies/Results: No results found.  Medications: . enoxaparin (LOVENOX) injection  40 mg Subcutaneous Q24H  . methocarbamol  500 mg Oral TID  .  pantoprazole (PROTONIX) IV  40 mg Intravenous QHS  . piperacillin-tazobactam (ZOSYN)  IV  3.375 g Intravenous Q8H    Assessment/Plan Acute cholecystitis, cholelithiasis, umbilical hernia with necrotic changes at the cystic duct.  S/p Laparoscopic cholecystectomy; Umbilical hernia repair, 07/01/2013, Adolph Pollack, MD  OSA on CPAP  Renal Insuffiency  Add Lovenox for DVT  Gout  Plan:  CXR/VQ SCAN this AM.  Colchicine for right great toe,  most likely gout.   VQ scan read by Dr. Margo Aye and show high probability for PE.  I have ordered pharmacy to start heparin and am consulting medicine.    LOS: 5 days    Villa Burgin 07/05/2013

## 2013-07-05 NOTE — Progress Notes (Addendum)
ANTICOAGULATION CONSULT NOTE - Initial Consult  Pharmacy Consult for heparin Indication: pulmonary embolus  No Known Allergies  Patient Measurements: Height: 5\' 11"  (180.3 cm) Weight: 236 lb 15.9 oz (107.5 kg) IBW/kg (Calculated) : 75.3 Heparin Dosing Weight: 98 kg  Vital Signs: Temp: 98.7 F (37.1 C) (09/10 0637) Temp src: Oral (09/10 0637) BP: 147/74 mmHg (09/10 0637) Pulse Rate: 86 (09/10 0637)  Labs:  Recent Labs  07/03/13 0430 07/04/13 0435 07/05/13 0440  HGB 12.3* 11.5* 10.8*  HCT 36.4* 34.9* 31.9*  PLT 202 218 215  CREATININE 1.48* 1.47* 1.39*    Estimated Creatinine Clearance: 76.7 ml/min (by C-G formula based on Cr of 1.39).   Medical History: Past Medical History  Diagnosis Date  . Sleep apnea     uses CPAP at night    Medications:  Scheduled:  . colchicine  0.6 mg Oral BID  . enoxaparin (LOVENOX) injection  40 mg Subcutaneous Q24H  . methocarbamol  500 mg Oral TID  . pantoprazole (PROTONIX) IV  40 mg Intravenous QHS  . piperacillin-tazobactam (ZOSYN)  IV  3.375 g Intravenous Q8H    Assessment: 51yo M s/p lap chole, umbilical hernia repair on 9/6. Remaining SOB, VQ scan showed high-probability of PE so pharmacy is consulted to begin heparin.  Lovenox 40mg  was given at 2100 last night.  CBC stable since surgery.  No baseline coag results.  Goal of Therapy:  Heparin level 0.3-0.7 units/ml Monitor platelets by anticoagulation protocol: Yes   Plan:   Draw baseline aPTT and PT/INR now.  Give heparin 2800units IV bolus then start infusion at 1700units/hr.  Check heparin level in 6hrs and daily therafter.  Check CBC q48h while on heparin.  F/u plan for oral anticoagulation.  Charolotte Eke, PharmD, pager [phone]. 07/05/2013,12:52 PM.  Addendum: baseline coags wnl.  Charolotte Eke, PharmD, pager [phone]. 07/05/2013,1:57 PM.

## 2013-07-05 NOTE — Progress Notes (Signed)
VASCULAR LAB PRELIMINARY  PRELIMINARY  PRELIMINARY  PRELIMINARY  Bilateral lower extremity venous duplex  completed.    Preliminary report:  Bilateral:  No evidence of DVT, superficial thrombosis, or Baker's Cyst.    Gabryel Files, RVT 07/05/2013, 3:19 PM

## 2013-07-06 LAB — HEPARIN LEVEL (UNFRACTIONATED)
Heparin Unfractionated: 0.47 IU/mL (ref 0.30–0.70)
Heparin Unfractionated: 0.33 IU/mL (ref 0.30–0.70)

## 2013-07-06 LAB — CBC
HCT: 32.3 % — ABNORMAL LOW (ref 39.0–52.0)
Hemoglobin: 11 g/dL — ABNORMAL LOW (ref 13.0–17.0)
MCH: 30.1 pg (ref 26.0–34.0)
MCHC: 34.1 g/dL (ref 30.0–36.0)
MCV: 88.5 fL (ref 78.0–100.0)
RBC: 3.65 MIL/uL — ABNORMAL LOW (ref 4.22–5.81)

## 2013-07-06 LAB — BASIC METABOLIC PANEL
BUN: 17 mg/dL (ref 6–23)
CO2: 25 mEq/L (ref 19–32)
Calcium: 8.9 mg/dL (ref 8.4–10.5)
Creatinine, Ser: 1.43 mg/dL — ABNORMAL HIGH (ref 0.50–1.35)
GFR calc non Af Amer: 55 mL/min — ABNORMAL LOW (ref >90)
Glucose, Bld: 108 mg/dL — ABNORMAL HIGH (ref 70–99)

## 2013-07-06 LAB — PROTIME-INR: INR: 1.25 (ref 0.00–1.49)

## 2013-07-06 MED ORDER — RIVAROXABAN 20 MG PO TABS: 20.0000 mg | ORAL_TABLET | Freq: Every day | ORAL | Status: DC

## 2013-07-06 MED ORDER — PREDNISONE 50 MG PO TABS
50.0000 mg | ORAL_TABLET | Freq: Every day | ORAL | Status: DC
  Administered 2013-07-06 – 2013-07-07 (×2): 50 mg via ORAL
  Filled 2013-07-06 (×4): qty 1

## 2013-07-06 MED ORDER — PANTOPRAZOLE SODIUM 40 MG PO TBEC
40.0000 mg | DELAYED_RELEASE_TABLET | Freq: Every day | ORAL | Status: DC
  Administered 2013-07-06: 40 mg via ORAL
  Filled 2013-07-06 (×2): qty 1

## 2013-07-06 MED ORDER — RIVAROXABAN 15 MG PO TABS
15.0000 mg | ORAL_TABLET | Freq: Two times a day (BID) | ORAL | Status: DC
  Administered 2013-07-06 – 2013-07-07 (×2): 15 mg via ORAL
  Filled 2013-07-06 (×4): qty 1

## 2013-07-06 NOTE — Progress Notes (Signed)
PHARMACIST - PHYSICIAN COMMUNICATION CONCERNING:  IV to Oral Route Change Policy  RECOMMENDATION: This patient is receiving Protonix by the intravenous route.  Based on criteria approved by the Pharmacy and Therapeutics Committee, Protonix is being converted to the equivalent oral dose form(s).  DESCRIPTION: These criteria include:  Has a documented ability to take oral medications (tolerating diet of full liquids or better or  Gastric tube feedings for >24 hours OR taking other scheduled oral medications for >24 hours).  Expected plan for continued treatment for at least 1 day.  If you have questions about this conversion, please contact the Pharmacy Department  []   [phone] )  [facility] []   [phone] )  [facility]  []   [phone] )  [facility] [x]   [phone] )  [facility]   Geoffry Paradise, PharmD, BCPS Pager: [phone] 7:53 AM Pharmacy #: [phone]

## 2013-07-06 NOTE — Progress Notes (Addendum)
[facility] PROGRESS NOTE  Allen Whitney AVW:[id number] DOB: [birth year] DOA: 06/30/2013 PCP: Daisy Floro, MD  Assessment/Plan:  1. Pulmonary embolism -VQ scan with high probability  -lower ext venous Dopplers negative for DVT -d/w pt will transition to Xarelto, needs 15mg  po BID for 3 weeks followed by 20mg  Daily -stop IV heparin -will need anticoagulation for 3-6 months atleast.  -Case manager consult appreciated   2. S/p Lap chole with drain  -per CCS   3. Gout flare  -L foot MTP joint more inflamed today -add Prednisone -continue colchicine, unable to use NSAIDs -uric acid level WNL  4. Mild renal insuffiency -cut down IVF, suspect related to acute illness, hypotension, orthostatic state early this admission, continue IVF, cut down rate.  Unknown baseline  I will followup again this afternoon.  [name], MD [phone]   HPI/Subjective: More pain in L first toe, dyspnea with activity a little better, still with pleuritic pain, ambulating less due to toe  Objective: Filed Vitals:   07/06/13 0506  BP: 148/85  Pulse: 77  Temp: 97.8 F (36.6 C)  Resp: 18    Intake/Output Summary (Last 24 hours) at 07/06/13 0832 Last data filed at 07/05/13 2200  Gross per 24 hour  Intake   1140 ml  Output      0 ml  Net   1140 ml   Filed Weights   06/30/13 1700  Weight: 107.5 kg (236 lb 15.9 oz)    Exam:   General:  AAOx3  Cardiovascular: S1S2/RRR  Respiratory: diminished at bases  Abdomen: soft, distended obese, drain   Musculoskeletal: L MTP with more tenderness and swelling   Data Reviewed: Basic Metabolic Panel:  Recent Labs Lab 06/30/13 1724 07/03/13 0430 07/04/13 0435 07/05/13 0440 07/06/13 0355  NA 135 136 136 136 132*  K 4.2 4.4 4.3 4.3 4.0  CL 97 99 101 103 98  CO2 31 29 29 26 25   GLUCOSE 101* 133* 116* 105* 108*  BUN 15 14 16 18 17   CREATININE 1.29 1.48* 1.47* 1.39* 1.43*  CALCIUM 9.5 9.2 9.0 8.7 8.9   Liver Function  Tests:  Recent Labs Lab 06/30/13 1724 07/03/13 0430  AST 20 31  ALT 23 38  ALKPHOS 57 62  BILITOT 0.7 0.8  PROT 7.1 6.7  ALBUMIN 3.5 2.8*   No results found for this basename: LIPASE, AMYLASE,  in the last 168 hours No results found for this basename: AMMONIA,  in the last 168 hours CBC:  Recent Labs Lab 06/30/13 1724 07/03/13 0430 07/04/13 0435 07/05/13 0440 07/06/13 0355  WBC 8.7 14.2* 10.3 8.6 9.4  NEUTROABS 6.0  --   --   --   --   HGB 12.9* 12.3* 11.5* 10.8* 11.0*  HCT 38.4* 36.4* 34.9* 31.9* 32.3*  MCV 90.4 91.7 91.6 90.6 88.5  PLT 249 202 218 215 254   Cardiac Enzymes: No results found for this basename: CKTOTAL, CKMB, CKMBINDEX, TROPONINI,  in the last 168 hours BNP (last 3 results) No results found for this basename: PROBNP,  in the last 8760 hours CBG:  Recent Labs Lab 07/02/13 1641  GLUCAP 133*    Recent Results (from the past 240 hour(s))  SURGICAL PCR SCREEN     Status: None   Collection Time    06/30/13  4:46 PM      Result Value Range Status   MRSA, PCR NEGATIVE  NEGATIVE Final   Staphylococcus aureus NEGATIVE  NEGATIVE Final   Comment:  The Xpert SA Assay (FDA     approved for NASAL specimens     in patients over 4 years of age),     is one component of     a comprehensive surveillance     program.  Test performance has     been validated by [employer] for patients greater     than or equal to 82 year old.     It is not intended     to diagnose infection nor to     guide or monitor treatment.     Studies: Dg Chest 2 View  07/05/2013   CLINICAL DATA:  52 year old male with shortness of Breath status post recent cholecystectomy.  EXAM: CHEST  2 VIEW  COMPARISON:  07/01/2013.  FINDINGS: Stable lung volumes with mild elevation of the right hemidiaphragm. Cardiac size is stable at the upper limits of normal. Other mediastinal contours are within normal limits. Visualized tracheal air column is within normal limits. No  pneumothorax or pulmonary edema. Trace bilateral pleural effusions. No confluent pulmonary opacity. Right upper quadrant surgical clips and possible drain in place.  IMPRESSION: Trace pleural effusions. No other acute cardiopulmonary abnormality.   Electronically Signed   By: Augusto Gamble M.D.   On: 07/05/2013 12:12   Nm Pulmonary Perf And Vent  07/05/2013   CLINICAL DATA:  52 year old male with shortness of breast status post recent abdominal surgery.  EXAM: NUCLEAR MEDICINE VENTILATION - PERFUSION LUNG SCAN  TECHNIQUE: Ventilation images were obtained in multiple projections using inhaled aerosol technetium 99 M DTPA. Perfusion images were obtained in multiple projections after intravenous injection of Tc-30m MAA.  COMPARISON:  Chest radiographs 1120 hr the same day.  RADIOPHARMACEUTICALS:  44.0 mCi Tc-40m DTPA aerosol and 6.4 mCi Tc-60m MAA  FINDINGS: Ventilation: Good quality ventilation study.  No ventilation defect.  Perfusion: Multiple bilateral perihilar perfusion defects , wedge-shaped and extending into the periphery of the lungs.  IMPRESSION: High probability of acute pulmonary embolus.  Study discussed by telephone with Dr. Marlyne Beards on 07/05/2013 at 12:19 .   Electronically Signed   By: Augusto Gamble M.D.   On: 07/05/2013 12:33    Scheduled Meds: . colchicine  0.6 mg Oral BID  . pantoprazole  40 mg Oral QHS  . piperacillin-tazobactam (ZOSYN)  IV  3.375 g Intravenous Q8H  . predniSONE  50 mg Oral Q breakfast   Continuous Infusions: . sodium chloride 75 mL/hr at 07/05/13 1425  . heparin 2,350 Units/hr (07/06/13 0108)    Principal Problem:   Cholecystitis with cholelithiasis Active Problems:   Acute pulmonary embolism    Time spent:30min    Allen Whitney  [facility] Pager [phone] If 7PM-7AM, please contact night-coverage at [email or web address], password [facility] 07/06/2013, 8:32 AM  LOS: 6 days

## 2013-07-06 NOTE — Progress Notes (Addendum)
ANTICOAGULATION CONSULT NOTE - Follow Up Consult  Pharmacy Consult for IV heparin Indication: pulmonary embolus  No Known Allergies  Patient Measurements: Height: 5\' 11"  (180.3 cm) Weight: 236 lb 15.9 oz (107.5 kg) IBW/kg (Calculated) : 75.3 Heparin Dosing Weight: 98 kg  Vital Signs: Temp: 97.8 F (36.6 C) (09/11 0506) Temp src: Oral (09/11 0506) BP: 148/85 mmHg (09/11 0506) Pulse Rate: 77 (09/11 0506)  Labs:  Recent Labs  07/04/13 0435 07/05/13 0440 07/05/13 1305 07/05/13 2004 07/06/13 0355 07/06/13 1144  HGB 11.5* 10.8*  --   --  11.0*  --   HCT 34.9* 31.9*  --   --  32.3*  --   PLT 218 215  --   --  254  --   APTT  --   --  32  --   --   --   LABPROT  --   --  15.0  --  15.4*  --   INR  --   --  1.21  --  1.25  --   HEPARINUNFRC  --   --   --  0.14* 0.47 0.33  CREATININE 1.47* 1.39*  --   --  1.43*  --     Estimated Creatinine Clearance: 74.5 ml/min (by C-G formula based on Cr of 1.43).   Assessment: 72 yoM admitted 9/5 for cholecystitis with cholelithiasis s/p laparoscopic cholecystectomy and umbilical hernia repair 9/6. Prophylaxis Lovenox started 9/9. VQ scan 9/10 with high probability of acute PE. IV heparin started.   IV heparin currently running at 2350 units/hr. Confirmatory heparin level therapeutic at 0.33. BLE venous duplex neg for DVT. Plan transition to Xarelto but awaiting discussion with family. Scr 1.43, CBC okay, no bleeding.  Goal of Therapy:  Heparin level 0.3-0.7 units/ml Monitor platelets by anticoagulation protocol: Yes   Plan:   Continue IV heparin at 2350 units/hr  Daily heparin level and CBC  F/u decision to transition to Xarelto.  Possible discharge tomorrow if stable.  Geoffry Paradise, PharmD, BCPS Pager: [phone] 12:15 PM Pharmacy #: [phone]     Addendum:  MD order to stop IV heparin and start Xarelto.   Plan: When IV heparin is stopped, give Xarelto 15mg  po BID with food x 21 days then 20mg  once daily with food.  Pharmacy will d/c IV heparin and related labs  Geoffry Paradise, PharmD, BCPS Pager: [phone] 2:00 PM Pharmacy #: [phone]

## 2013-07-06 NOTE — Progress Notes (Signed)
PHARMACY BRIEF NOTE -- Anticoagulation  Consult for:  IV Heparin Indication:  Acute PE  With infusion of 2350 units/hr & bolus 3000 units x1, the Heparin level drawn at 0355 today was reported as 0.47 units/ml.  This level is in the therapeutic range, 0.3-0.7 units/ml.  No IV interuptions or bleeding was reported from RN.  Plan:  Continue Heparin drip @  2350 units/hr  Recheck Heparin level @ 11am today, to ensure stays in therapeutic range.  Lorenza Evangelist 07/06/2013 5:31 AM

## 2013-07-06 NOTE — Progress Notes (Signed)
5 Days Post-Op  Subjective: He feels somewhat better this am  Still hurts to take a deep breath.  Abdomen is sore, but he's eating and doing well with PO's.  His Left great toe is still swollen and erythematous, the colchicine has not improved his symptoms.  Objective: Vital signs in last 24 hours: Temp:  [97.8 F (36.6 C)-99.3 F (37.4 C)] 97.8 F (36.6 C) (09/11 0506) Pulse Rate:  [77-89] 77 (09/11 0506) Resp:  [18] 18 (09/11 0506) BP: (148-165)/(81-91) 148/85 mmHg (09/11 0506) SpO2:  [94 %-97 %] 94 % (09/11 0506) Last BM Date: 07/06/13 840 PO,  S/p BM x 1,  Afebrile, BP is up some Creatinine is 1.43 - stable  Intake/Output from previous day: 09/10 0701 - 09/11 0700 In: 1140 [P.O.:840; I.V.:300] Out: -  Intake/Output this shift:    General appearance: alert, cooperative and no distress Resp: clear to auscultation bilaterally GI: soft, still tender, the drain has old serous blood, incisions look good,  Extremities: left great toe is swollen and erythematous. Very tender  Lab Results:   Recent Labs  07/05/13 0440 07/06/13 0355  WBC 8.6 9.4  HGB 10.8* 11.0*  HCT 31.9* 32.3*  PLT 215 254    BMET  Recent Labs  07/05/13 0440 07/06/13 0355  NA 136 132*  K 4.3 4.0  CL 103 98  CO2 26 25  GLUCOSE 105* 108*  BUN 18 17  CREATININE 1.39* 1.43*  CALCIUM 8.7 8.9   PT/INR  Recent Labs  07/05/13 1305 07/06/13 0355  LABPROT 15.0 15.4*  INR 1.21 1.25     Recent Labs Lab 06/30/13 1724 07/03/13 0430  AST 20 31  ALT 23 38  ALKPHOS 57 62  BILITOT 0.7 0.8  PROT 7.1 6.7  ALBUMIN 3.5 2.8*     Lipase  No results found for this basename: lipase     Studies/Results: Dg Chest 2 View  07/05/2013   CLINICAL DATA:  52 year old male with shortness of Breath status post recent cholecystectomy.  EXAM: CHEST  2 VIEW  COMPARISON:  07/01/2013.  FINDINGS: Stable lung volumes with mild elevation of the right hemidiaphragm. Cardiac size is stable at the upper limits of  normal. Other mediastinal contours are within normal limits. Visualized tracheal air column is within normal limits. No pneumothorax or pulmonary edema. Trace bilateral pleural effusions. No confluent pulmonary opacity. Right upper quadrant surgical clips and possible drain in place.  IMPRESSION: Trace pleural effusions. No other acute cardiopulmonary abnormality.   Electronically Signed   By: Augusto Gamble M.D.   On: 07/05/2013 12:12   Nm Pulmonary Perf And Vent  07/05/2013   CLINICAL DATA:  52 year old male with shortness of breast status post recent abdominal surgery.  EXAM: NUCLEAR MEDICINE VENTILATION - PERFUSION LUNG SCAN  TECHNIQUE: Ventilation images were obtained in multiple projections using inhaled aerosol technetium 99 M DTPA. Perfusion images were obtained in multiple projections after intravenous injection of Tc-76m MAA.  COMPARISON:  Chest radiographs 1120 hr the same day.  RADIOPHARMACEUTICALS:  44.0 mCi Tc-80m DTPA aerosol and 6.4 mCi Tc-69m MAA  FINDINGS: Ventilation: Good quality ventilation study.  No ventilation defect.  Perfusion: Multiple bilateral perihilar perfusion defects , wedge-shaped and extending into the periphery of the lungs.  IMPRESSION: High probability of acute pulmonary embolus.  Study discussed by telephone with Dr. Marlyne Beards on 07/05/2013 at 12:19 .   Electronically Signed   By: Augusto Gamble M.D.   On: 07/05/2013 12:33    Medications: . colchicine  0.6 mg Oral BID  . methocarbamol  500 mg Oral TID  . pantoprazole (PROTONIX) IV  40 mg Intravenous QHS  . piperacillin-tazobactam (ZOSYN)  IV  3.375 g Intravenous Q8H    Assessment/Plan Acute cholecystitis, cholelithiasis, umbilical hernia with necrotic changes at the cystic duct.  S/p Laparoscopic cholecystectomy; Umbilical hernia repair, 07/01/2013, Adolph Pollack, MD  OSA on CPAP  Renal Insuffiency  Add Lovenox for DVT  Gout - No improvement on colchicine Pulmonary Embolism   Plan:  I will defer to Dr. Jomarie Longs, but  he may need some steroids for his gout, continue anticoagulant.  He can come off fluids from a surgical standpoint, but I will check with Dr. Jomarie Longs and see if she wants to continue it for his renal function.    LOS: 6 days    Allen Whitney 07/06/2013

## 2013-07-06 NOTE — Progress Notes (Signed)
Agree with A&P of WJ,PA. He is up and bout and seeems to feel better than yesterday. Should be able to go home soon

## 2013-07-07 ENCOUNTER — Encounter (HOSPITAL_COMMUNITY): Payer: Self-pay | Admitting: General Surgery

## 2013-07-07 DIAGNOSIS — G4733 Obstructive sleep apnea (adult) (pediatric): Secondary | ICD-10-CM | POA: Diagnosis present

## 2013-07-07 DIAGNOSIS — N289 Disorder of kidney and ureter, unspecified: Secondary | ICD-10-CM

## 2013-07-07 DIAGNOSIS — M109 Gout, unspecified: Secondary | ICD-10-CM

## 2013-07-07 HISTORY — DX: Disorder of kidney and ureter, unspecified: N28.9

## 2013-07-07 HISTORY — DX: Gout, unspecified: M10.9

## 2013-07-07 LAB — CBC
Platelets: 323 10*3/uL (ref 150–400)
RDW: 13.5 % (ref 11.5–15.5)
WBC: 10.2 10*3/uL (ref 4.0–10.5)

## 2013-07-07 LAB — BASIC METABOLIC PANEL
Calcium: 9.5 mg/dL (ref 8.4–10.5)
Creatinine, Ser: 1.36 mg/dL — ABNORMAL HIGH (ref 0.50–1.35)
GFR calc Af Amer: 67 mL/min — ABNORMAL LOW (ref >90)

## 2013-07-07 MED ORDER — ACETAMINOPHEN 325 MG PO TABS: 650.0000 mg | ORAL_TABLET | Freq: Four times a day (QID) | ORAL | Status: DC | PRN

## 2013-07-07 MED ORDER — RIVAROXABAN 15 MG PO TABS: 15.0000 mg | ORAL_TABLET | Freq: Two times a day (BID) | ORAL | Status: DC

## 2013-07-07 MED ORDER — RIVAROXABAN 20 MG PO TABS: 20.0000 mg | ORAL_TABLET | Freq: Every day | ORAL | Status: DC

## 2013-07-07 MED ORDER — HYDROCODONE-ACETAMINOPHEN 5-325 MG PO TABS: 1.0000 | ORAL_TABLET | ORAL | Status: DC | PRN

## 2013-07-07 NOTE — Progress Notes (Signed)
6 Days Post-Op  Subjective: Feeling better, drain unchanged, breathing still not back to normal.  Toe is better he can move it today.  Objective: Vital signs in last 24 hours: Temp:  [97.5 F (36.4 C)-98.7 F (37.1 C)] 98.1 F (36.7 C) (09/12 0809) Pulse Rate:  [70-82] 70 (09/12 0809) Resp:  [17-18] 17 (09/12 0809) BP: (148-161)/(73-84) 151/84 mmHg (09/12 0809) SpO2:  [94 %-96 %] 95 % (09/12 0809) Last BM Date: 07/06/13 840 PO Afebrile, VSS BP up some. Labs OK Intake/Output from previous day: 09/11 0701 - 09/12 0700 In: 20  Out: -  Intake/Output this shift:    General appearance: alert, cooperative and no distress Resp: clear to auscultation bilaterally GI: soft, still tender, drain shows old blood.  incisions look fine.  Lab Results:   Recent Labs  07/06/13 0355 07/07/13 0438  WBC 9.4 10.2  HGB 11.0* 11.6*  HCT 32.3* 33.6*  PLT 254 323    BMET  Recent Labs  07/06/13 0355 07/07/13 0438  NA 132* 136  K 4.0 4.1  CL 98 103  CO2 25 25  GLUCOSE 108* 111*  BUN 17 23  CREATININE 1.43* 1.36*  CALCIUM 8.9 9.5   PT/INR  Recent Labs  07/05/13 1305 07/06/13 0355  LABPROT 15.0 15.4*  INR 1.21 1.25     Recent Labs Lab 06/30/13 1724 07/03/13 0430  AST 20 31  ALT 23 38  ALKPHOS 57 62  BILITOT 0.7 0.8  PROT 7.1 6.7  ALBUMIN 3.5 2.8*     Lipase  No results found for this basename: lipase     Studies/Results: Dg Chest 2 View  07/05/2013   CLINICAL DATA:  52 year old male with shortness of Breath status post recent cholecystectomy.  EXAM: CHEST  2 VIEW  COMPARISON:  07/01/2013.  FINDINGS: Stable lung volumes with mild elevation of the right hemidiaphragm. Cardiac size is stable at the upper limits of normal. Other mediastinal contours are within normal limits. Visualized tracheal air column is within normal limits. No pneumothorax or pulmonary edema. Trace bilateral pleural effusions. No confluent pulmonary opacity. Right upper quadrant surgical clips  and possible drain in place.  IMPRESSION: Trace pleural effusions. No other acute cardiopulmonary abnormality.   Electronically Signed   By: Augusto Gamble M.D.   On: 07/05/2013 12:12   Nm Pulmonary Perf And Vent  07/05/2013   CLINICAL DATA:  52 year old male with shortness of breast status post recent abdominal surgery.  EXAM: NUCLEAR MEDICINE VENTILATION - PERFUSION LUNG SCAN  TECHNIQUE: Ventilation images were obtained in multiple projections using inhaled aerosol technetium 99 M DTPA. Perfusion images were obtained in multiple projections after intravenous injection of Tc-63m MAA.  COMPARISON:  Chest radiographs 1120 hr the same day.  RADIOPHARMACEUTICALS:  44.0 mCi Tc-65m DTPA aerosol and 6.4 mCi Tc-10m MAA  FINDINGS: Ventilation: Good quality ventilation study.  No ventilation defect.  Perfusion: Multiple bilateral perihilar perfusion defects , wedge-shaped and extending into the periphery of the lungs.  IMPRESSION: High probability of acute pulmonary embolus.  Study discussed by telephone with Dr. Marlyne Beards on 07/05/2013 at 12:19 .   Electronically Signed   By: Augusto Gamble M.D.   On: 07/05/2013 12:33    Medications: . pantoprazole  40 mg Oral QHS  . predniSONE  50 mg Oral Q breakfast  . rivaroxaban  15 mg Oral BID WC   Followed by  . [START ON 07/28/2013] rivaroxaban  20 mg Oral Q breakfast    Assessment/Plan Acute cholecystitis, cholelithiasis, umbilical hernia  with necrotic changes at the cystic duct.  S/p Laparoscopic cholecystectomy; Umbilical hernia repair, 07/01/2013, Adolph Pollack, MD  OSA on CPAP  Renal Insuffiency  Gout - No improvement on colchicine  Pulmonary Embolism Daisy Floro, MD  PCP   Plan:  I think he is ready to go home.  I have follow up appointment with Dr. Abbey Chatters next week for drain removal.  I have called Dr. [name] office and they will get him set up for visit next week. I have told them about his renal insuffiencey and his PE.     LOS: 7 days     Kenyia Wambolt 07/07/2013

## 2013-07-07 NOTE — Progress Notes (Signed)
[facility] PROGRESS NOTE  Allen Whitney AVW:[id number] DOB: 1961-03-07 DOA: 06/30/2013 PCP: Daisy Floro, MD  Assessment/Plan:  1. Pulmonary embolism -VQ scan with high probability  -lower ext venous Dopplers negative for DVT -transitioned to Xarelto yesterday, needs 15mg  po BID for 3 weeks followed by 20mg  Daily-I've written a prescription for this  -will need anticoagulation for 3-6 months atleast.  -Case manager consult appreciated -Fu with PCP Dr.Ross in 1 week, needs Bmet in 1 week as well   2. S/p Lap chole with drain  -per [facility]   3. Gout flare  -improved, can Dc home on Prednisone 40mg  daily for 2days -stop colchicine  4. Mild renal insuffiency -improving -needs Bmet in 1 week at time of FU with PCP  Thank you for this consult Medically stable for discharge  [name], MD [phone]   HPI/Subjective: Feels better, breathing better, L toe pain improved  Objective: Filed Vitals:   07/07/13 0809  BP: 151/84  Pulse: 70  Temp: 98.1 F (36.7 C)  Resp: 17    Intake/Output Summary (Last 24 hours) at 07/07/13 0945 Last data filed at 07/06/13 1324  Gross per 24 hour  Intake     20 ml  Output      0 ml  Net     20 ml   Filed Weights   06/30/13 1700  Weight: 107.5 kg (236 lb 15.9 oz)    Exam:   General:  AAOx3  Cardiovascular: S1S2/RRR  Respiratory: diminished at bases  Abdomen: soft, distended obese, drain   Musculoskeletal: L MTP with more tenderness and swelling much better  Data Reviewed: Basic Metabolic Panel:  Recent Labs Lab 07/03/13 0430 07/04/13 0435 07/05/13 0440 07/06/13 0355 07/07/13 0438  NA 136 136 136 132* 136  K 4.4 4.3 4.3 4.0 4.1  CL 99 101 103 98 103  CO2 29 29 26 25 25   GLUCOSE 133* 116* 105* 108* 111*  BUN 14 16 18 17 23   CREATININE 1.48* 1.47* 1.39* 1.43* 1.36*  CALCIUM 9.2 9.0 8.7 8.9 9.5   Liver Function Tests:  Recent Labs Lab 06/30/13 1724 07/03/13 0430  AST 20 31  ALT 23 38  ALKPHOS  57 62  BILITOT 0.7 0.8  PROT 7.1 6.7  ALBUMIN 3.5 2.8*   No results found for this basename: LIPASE, AMYLASE,  in the last 168 hours No results found for this basename: AMMONIA,  in the last 168 hours CBC:  Recent Labs Lab 06/30/13 1724 07/03/13 0430 07/04/13 0435 07/05/13 0440 07/06/13 0355 07/07/13 0438  WBC 8.7 14.2* 10.3 8.6 9.4 10.2  NEUTROABS 6.0  --   --   --   --   --   HGB 12.9* 12.3* 11.5* 10.8* 11.0* 11.6*  HCT 38.4* 36.4* 34.9* 31.9* 32.3* 33.6*  MCV 90.4 91.7 91.6 90.6 88.5 88.7  PLT 249 202 218 215 254 323   Cardiac Enzymes: No results found for this basename: CKTOTAL, CKMB, CKMBINDEX, TROPONINI,  in the last 168 hours BNP (last 3 results) No results found for this basename: PROBNP,  in the last 8760 hours CBG:  Recent Labs Lab 07/02/13 1641  GLUCAP 133*    Recent Results (from the past 240 hour(s))  SURGICAL PCR SCREEN     Status: None   Collection Time    06/30/13  4:46 PM      Result Value Range Status   MRSA, PCR NEGATIVE  NEGATIVE Final   Staphylococcus aureus NEGATIVE  NEGATIVE Final   Comment:  The Xpert SA Assay (FDA     approved for NASAL specimens     in patients over 50 years of age),     is one component of     a comprehensive surveillance     program.  Test performance has     been validated by [employer] for patients greater     than or equal to 43 year old.     It is not intended     to diagnose infection nor to     guide or monitor treatment.     Studies: Dg Chest 2 View  07/05/2013   CLINICAL DATA:  52 year old male with shortness of Breath status post recent cholecystectomy.  EXAM: CHEST  2 VIEW  COMPARISON:  07/01/2013.  FINDINGS: Stable lung volumes with mild elevation of the right hemidiaphragm. Cardiac size is stable at the upper limits of normal. Other mediastinal contours are within normal limits. Visualized tracheal air column is within normal limits. No pneumothorax or pulmonary edema. Trace bilateral  pleural effusions. No confluent pulmonary opacity. Right upper quadrant surgical clips and possible drain in place.  IMPRESSION: Trace pleural effusions. No other acute cardiopulmonary abnormality.   Electronically Signed   By: Augusto Gamble M.D.   On: 07/05/2013 12:12   Nm Pulmonary Perf And Vent  07/05/2013   CLINICAL DATA:  52 year old male with shortness of breast status post recent abdominal surgery.  EXAM: NUCLEAR MEDICINE VENTILATION - PERFUSION LUNG SCAN  TECHNIQUE: Ventilation images were obtained in multiple projections using inhaled aerosol technetium 99 M DTPA. Perfusion images were obtained in multiple projections after intravenous injection of Tc-16m MAA.  COMPARISON:  Chest radiographs 1120 hr the same day.  RADIOPHARMACEUTICALS:  44.0 mCi Tc-87m DTPA aerosol and 6.4 mCi Tc-33m MAA  FINDINGS: Ventilation: Good quality ventilation study.  No ventilation defect.  Perfusion: Multiple bilateral perihilar perfusion defects , wedge-shaped and extending into the periphery of the lungs.  IMPRESSION: High probability of acute pulmonary embolus.  Study discussed by telephone with Dr. Marlyne Beards on 07/05/2013 at 12:19 .   Electronically Signed   By: Augusto Gamble M.D.   On: 07/05/2013 12:33    Scheduled Meds: . pantoprazole  40 mg Oral QHS  . predniSONE  50 mg Oral Q breakfast  . rivaroxaban  15 mg Oral BID WC   Followed by  . [START ON 07/28/2013] rivaroxaban  20 mg Oral Q breakfast   Continuous Infusions:    Principal Problem:   Cholecystitis with cholelithiasis Active Problems:   Acute pulmonary embolism    Time spent:68min    [name]  [facility] Pager [phone] If 7PM-7AM, please contact night-coverage at [email or web address], password [facility] 07/07/2013, 9:45 AM  LOS: 7 days

## 2013-07-07 NOTE — Progress Notes (Signed)
Physician Discharge Summary  Patient ID: Allen Whitney MRN: [id number] DOB/AGE: 12/04/1960 52 y.o.  Admit date: 06/30/2013 Discharge date: 07/07/2013  Admission Diagnoses: Cholecystitis with cholelithiasis OSA with Cpap    Discharge Diagnoses: Acute cholecystitis, cholelithiasis,with necrotic changes at cystic duct,  umbilical hernia.  Pulmonary embolism Renal Insufficiency Gout Right great toe OSA Body mass index is 33   Principal Problem:   Cholecystitis with cholelithiasis Active Problems:   Acute pulmonary embolism   Renal insufficiency, mild   OSA on CPAP   Gout attack   PROCEDURES: Laparoscopic cholecystectomy; Umbilical hernia repair, 07/01/2013,  Adolph Pollack, MD.     Hospital Course: Chief Complaint: abdominal pain  HPI: the patient is a 52 year old white male who has been experiencing severe right back pain for the last 6 days. He has had moderate right upper quadrant abdominal pain. He has had nausea with some dry heaves. The pain has been fairly persistent. He has had an ultrasound which showed stones in the gallbladder and some gallbladder wall thickening. It also showed a stone lodged in the neck of the gallbladder. There was no ductal dilatation. He was seen in the office by Dr. Carolynne Edouard and admitted to the hospital.  He was placed on antibiotics, hydration and made NPO after MN.   He was seen following Am by Dr. Abbey Chatters and taken to the OR.  He underwent the above procedure.  He tolerated it well, but was very sore Sunday. He had a syncopal episode, which was report as a vagal episode.  He was seen by Dr. Jamey Ripa on 9/8. WBC was up as was his creatinine.  He had a some bleeding with surgery, but H/H was not significantly lower than preop. He didn't feel SOB but did complain of being light headed.  He is on CPAP and that was restarted on 9/8.  We saw him again in the PM of 9/8 and he still felt dizzy standing.  Fluids were continues and Orthostatic BP's were  requested.  On 9/9 he felt a little better, he had his CPAP and noted he had this for 2 years.  He also had a hx of low energy prior to becoming ill.  He did have some orthostatic hypotension and we gave him extra fluid. We attributed his dizziness and increased creatinine to dehydration.  On 07/05/13, he still complained of abdomen being tight, and feeling SOB bending over and sitting.  He also started complaining of pain right great toe.  He has a history of gout. His creatinine was a little better, but we felt he was to dyspneic and ordered a VQ scan.  This came back as high probablilty for PE.  He was place on Heparin per pharmacy with bolus.  Medicine and Dr. Jomarie Longs saw the pt for [place] at that time also.  He is now anticoagulated and he has less SOB with ambulation.  His renal function is slowly improving. His gout was treated with prednisone and this is better but not resolved.  He was converted to Xarelto and by the Am of 9/12 it was Dr. Jomarie Longs and Dr. Tenna Child opinion he could go home.  We have follow up with Dr. Abbey Chatters next week.  We have contacted Dr. Tenny Craw his PCP and ask they see him next week to check BMP and follow up on his medical issues/PE.  Condition on d/c:  Improved      Disposition:        Future Appointments Provider Department Dept Phone  07/12/2013 10:10 AM Adolph Pollack, MD [facility], [place] [phone]       Medication List    STOP taking these medications       ibuprofen 200 MG tablet  Commonly known as:  ADVIL,MOTRIN      TAKE these medications       acetaminophen 325 MG tablet  Commonly known as:  TYLENOL  Take 2 tablets (650 mg total) by mouth every 6 (six) hours as needed for pain.     HYDROcodone-acetaminophen 5-325 MG per tablet  Commonly known as:  NORCO/VICODIN  Take 1-2 tablets by mouth every 4 (four) hours as needed.     HYDROcodone-acetaminophen 5-325 MG per tablet  Commonly known as:  NORCO/VICODIN  Take 1 tablet by mouth  every 6 (six) hours as needed for pain.     methocarbamol 500 MG tablet  Commonly known as:  ROBAXIN  Take 500 mg by mouth 3 (three) times daily.     multivitamin with minerals tablet  Take 1 tablet by mouth daily.     ondansetron 4 MG tablet  Commonly known as:  ZOFRAN  Take 4 mg by mouth every 8 (eight) hours as needed for nausea.     Rivaroxaban 15 MG Tabs tablet  Commonly known as:  XARELTO  Take 1 tablet (15 mg total) by mouth 2 (two) times daily with a meal. For 3 weeks till 10/2     Rivaroxaban 20 MG Tabs tablet  Commonly known as:  XARELTO  Take 1 tablet (20 mg total) by mouth daily. Starting 10/3, take 1 tab Daily with evening meal  Start taking on:  07/28/2013       Follow-up Information   Follow up with Daisy Floro, MD. Schedule an appointment as soon as possible for a visit on 07/14/2013. (Dr Tenny Craw office staff should call and set something up for you to be seen next week.  If you don't hear from them by Monday call Monday morning for an appointment.)    Specialty:  Family Medicine   Contact information:   [address] [place] [address] [phone]       Follow up with Bmet  In 1 week.      Follow up with Adolph Pollack, MD On 07/12/2013. (Be at the office for check in at 10AM for appointment at 10:15 AM)    Specialty:  General Surgery   Contact information:   [address] [place] [address] [phone]       Signed: Sherrie George 07/07/2013, 11:57 AM

## 2013-07-07 NOTE — Progress Notes (Signed)
Agree with A&P of WJ,PA. Patient has made nice improvement over last two days and is able to go home

## 2013-07-11 NOTE — Discharge Summary (Signed)
Patient ID: Allen Whitney MRN: [id number] DOB/AGE: 1961/03/24 52 y.o.   Admit date: 06/30/2013 Discharge date: 07/07/2013   Admission Diagnoses: Cholecystitis with cholelithiasis OSA with Cpap       Discharge Diagnoses: Acute cholecystitis, cholelithiasis,with necrotic changes at cystic duct,  umbilical hernia.   Pulmonary embolism Renal Insufficiency Gout Right great toe OSA Body mass index is 33     Principal Problem:   Cholecystitis with cholelithiasis Active Problems:   Acute pulmonary embolism   Renal insufficiency, mild   OSA on CPAP   Gout attack   PROCEDURES: Laparoscopic cholecystectomy; Umbilical hernia repair, 07/01/2013,  Adolph Pollack, MD.         Hospital Course: Chief Complaint: abdominal pain   HPI: the patient is a 52 year old white male who has been experiencing severe right back pain for the last 6 days. He has had moderate right upper quadrant abdominal pain. He has had nausea with some dry heaves. The pain has been fairly persistent. He has had an ultrasound which showed stones in the gallbladder and some gallbladder wall thickening. It also showed a stone lodged in the neck of the gallbladder. There was no ductal dilatation. He was seen in the office by Dr. Carolynne Edouard and admitted to the hospital.  He was placed on antibiotics, hydration and made NPO after MN.    He was seen following Am by Dr. Abbey Chatters and taken to the OR.  He underwent the above procedure.  He tolerated it well, but was very sore Sunday. He had a syncopal episode, which was report as a vagal episode.  He was seen by Dr. Jamey Ripa on 9/8. WBC was up as was his creatinine.  He had a some bleeding with surgery, but H/H was not significantly lower than preop. He didn't feel SOB but did complain of being light headed.  He is on CPAP and that was restarted on 9/8.  We saw him again in the PM of 9/8 and he still felt dizzy standing.  Fluids were continues and Orthostatic BP's were  requested.  On 9/9 he felt a little better, he had his CPAP and noted he had this for 2 years.  He also had a hx of low energy prior to becoming ill.  He did have some orthostatic hypotension and we gave him extra fluid. We attributed his dizziness and increased creatinine to dehydration.  On 07/05/13, he still complained of abdomen being tight, and feeling SOB bending over and sitting.  He also started complaining of pain right great toe.  He has a history of gout. His creatinine was a little better, but we felt he was to dyspneic and ordered a VQ scan.  This came back as high probablilty for PE.  He was place on Heparin per pharmacy with bolus.  Medicine and Dr. Jomarie Longs saw the pt for [place] at that time also.  He is now anticoagulated and he has less SOB with ambulation.  His renal function is slowly improving. His gout was treated with prednisone and this is better but not resolved.  He was converted to Xarelto and by the Am of 9/12 it was Dr. Jomarie Longs and Dr. Tenna Child opinion he could go home.  We have follow up with Dr. Abbey Chatters next week.  We have contacted Dr. Tenny Craw his PCP and ask they see him next week to check BMP and follow up on his medical issues/PE.   Condition on d/c:  Improved  Disposition:             Future Appointments  Provider  Department  Dept Phone     07/12/2013 10:10 AM  Adolph Pollack, MD  [facility], [place]  [phone]          Medication List      STOP taking these medications           ibuprofen 200 MG tablet   Commonly known as:  ADVIL,MOTRIN          TAKE these medications           acetaminophen 325 MG tablet   Commonly known as:  TYLENOL   Take 2 tablets (650 mg total) by mouth every 6 (six) hours as needed for pain.         HYDROcodone-acetaminophen 5-325 MG per tablet   Commonly known as:  NORCO/VICODIN   Take 1-2 tablets by mouth every 4 (four) hours as needed.         HYDROcodone-acetaminophen 5-325 MG per tablet    Commonly known as:  NORCO/VICODIN   Take 1 tablet by mouth every 6 (six) hours as needed for pain.         methocarbamol 500 MG tablet   Commonly known as:  ROBAXIN   Take 500 mg by mouth 3 (three) times daily.         multivitamin with minerals tablet   Take 1 tablet by mouth daily.         ondansetron 4 MG tablet   Commonly known as:  ZOFRAN   Take 4 mg by mouth every 8 (eight) hours as needed for nausea.         Rivaroxaban 15 MG Tabs tablet   Commonly known as:  XARELTO   Take 1 tablet (15 mg total) by mouth 2 (two) times daily with a meal. For 3 weeks till 10/2         Rivaroxaban 20 MG Tabs tablet   Commonly known as:  XARELTO   Take 1 tablet (20 mg total) by mouth daily. Starting 10/3, take 1 tab Daily with evening meal   Start taking on:  07/28/2013           Follow-up Information     Follow up with Daisy Floro, MD. Schedule an appointment as soon as possible for a visit on 07/14/2013. (Dr Tenny Craw office staff should call and set something up for you to be seen next week.  If you don't hear from them by Monday call Monday morning for an appointment.)      Specialty:  Family Medicine     Contact information:     [address] [place] [address] [phone]           Follow up with Bmet  In 1 week.          Follow up with Adolph Pollack, MD On 07/12/2013. (Be at the office for check in at 10AM for appointment at 10:15 AM)      Specialty:  General Surgery     Contact information:     [address] [place] [address] [phone]          Signed: Sherrie George 07/07/2013, 11:57 AM         Cosigned by: Currie Paris, MD    [07/07/2013 1:56 PM]  Revision History...     Date/Time User Action   07/07/2013 1:56 PM Currie Paris, MD Cosign  07/07/2013 11:58 AM Sherrie George, PA-C Sign   07/07/2013 11:57 AM Sherrie George, PA-C Sign  View Details Report   Routing History...     Date/Time From To Method    07/07/2013 11:58 AM Sherrie George, PA-C Gildardo Cranker, MD Fax

## 2013-07-12 ENCOUNTER — Ambulatory Visit (INDEPENDENT_AMBULATORY_CARE_PROVIDER_SITE_OTHER): Payer: BC Managed Care – PPO | Admitting: General Surgery

## 2013-07-12 ENCOUNTER — Encounter (INDEPENDENT_AMBULATORY_CARE_PROVIDER_SITE_OTHER): Payer: Self-pay | Admitting: General Surgery

## 2013-07-12 VITALS — BP 130/70 | HR 80 | Resp 16 | Ht 71.0 in | Wt 225.0 lb

## 2013-07-12 DIAGNOSIS — Z9889 Other specified postprocedural states: Secondary | ICD-10-CM

## 2013-07-12 NOTE — Patient Instructions (Signed)
May shower tomorrow. Apply a  fresh bandage to the drain site wound daily until it is closed. Light activities for 2-4 more weeks.

## 2013-07-12 NOTE — Progress Notes (Signed)
Procedure:  Laparoscopic cholecystectomy and umbilical hernia repair  Date:  07/01/2013  Pathology:  Acute and chronic cholecystitis with cholelithiasis  History:  He is here for his first postoperative visit. His hospital course was complicated by some renal insufficiency and a pulmonary embolism. He still has his drain in.  He is on Xarelto.  His creatinine on 9/12 was 1.36.  It got as high as 1.48.  Exam: General- Is in NAD. Abdomen-soft, incisions are clean, intact, and solid. Drain output is serous. The drain was removed and a sterile dressing was applied.  Assessment:  Post cholecystectomy for acute and chronic cholecystitis. He also had a primary umbilical hernia repair. His course was complicated by pulmonary embolism. He'll need to be on anticoagulation likely for 6 months. He had mild acute renal insufficiency with his last creatinine coming down to 1.36.  Plan:  Light activities for 4-6 weeks. His primary care physician, Dr. Tenny Craw, is going to be seeing him in the near future and can manage his anticoagulation. He may also need to have his creatinine checked. Return visit with [place] as needed.

## 2013-07-13 ENCOUNTER — Ambulatory Visit (INDEPENDENT_AMBULATORY_CARE_PROVIDER_SITE_OTHER): Payer: BC Managed Care – PPO | Admitting: General Surgery

## 2013-07-13 ENCOUNTER — Telehealth (INDEPENDENT_AMBULATORY_CARE_PROVIDER_SITE_OTHER): Payer: Self-pay

## 2013-07-13 NOTE — Telephone Encounter (Signed)
Pt made aware of restrictions of light duty work (no lifting over 20 lbs) for 3-4 weeks after surgery, then full duty 6 weeks after surgery.  Pt understood instructions and agreed.

## 2013-07-24 ENCOUNTER — Telehealth (INDEPENDENT_AMBULATORY_CARE_PROVIDER_SITE_OTHER): Payer: Self-pay

## 2013-07-24 ENCOUNTER — Telehealth (INDEPENDENT_AMBULATORY_CARE_PROVIDER_SITE_OTHER): Payer: Self-pay | Admitting: *Deleted

## 2013-07-24 DIAGNOSIS — Z86711 Personal history of pulmonary embolism: Secondary | ICD-10-CM

## 2013-07-24 DIAGNOSIS — M549 Dorsalgia, unspecified: Secondary | ICD-10-CM

## 2013-07-24 DIAGNOSIS — Z9049 Acquired absence of other specified parts of digestive tract: Secondary | ICD-10-CM

## 2013-07-24 NOTE — Telephone Encounter (Signed)
I called and let the pt know that Dr Donell Beers wants [place] to order a CT abd/ pelvis with contrast to see what is going on.  He states he is 45 minutes away and he can't do tomorrow morning.

## 2013-07-24 NOTE — Telephone Encounter (Signed)
The pt called in s/p lap chole and hernia repair 06/30/2013.  He ended up having a PE postop.  He started having pain last week in his midback that is on the same level of the gallbladder.  It is a constant ache.  It feels the same as preop.  He also wakes up with it.  He has no shortness of breath.  He is eating and drinking ok.  He is on Xarelto now for the PE.  He doesn't know if it's related to recovery of if it's a pulled muscle.  Since he had the PE I told him I would run it by a dr.  Please advise.

## 2013-07-24 NOTE — Telephone Encounter (Signed)
I called pt to inform him of his appointment at GI on [address] on 07/26/13 at 2:10pm.  Instructed pt to pick up the contrast from this location tomorrow 10/30.

## 2013-07-24 NOTE — Addendum Note (Signed)
Addended byIvory Broad on: 07/24/2013 10:54 AM   Modules accepted: Orders

## 2013-07-25 ENCOUNTER — Telehealth (INDEPENDENT_AMBULATORY_CARE_PROVIDER_SITE_OTHER): Payer: Self-pay

## 2013-07-25 ENCOUNTER — Other Ambulatory Visit (INDEPENDENT_AMBULATORY_CARE_PROVIDER_SITE_OTHER): Payer: Self-pay

## 2013-07-25 ENCOUNTER — Other Ambulatory Visit (INDEPENDENT_AMBULATORY_CARE_PROVIDER_SITE_OTHER): Payer: Self-pay | Admitting: General Surgery

## 2013-07-25 DIAGNOSIS — R1012 Left upper quadrant pain: Secondary | ICD-10-CM

## 2013-07-25 DIAGNOSIS — M549 Dorsalgia, unspecified: Secondary | ICD-10-CM

## 2013-07-25 NOTE — Telephone Encounter (Signed)
Pt has CT scheduled for 07/26/13.  Dr. Abbey Chatters would like his LFT's checked as well.  Orders entered in Epic.  Please ask the patient if he can go by [facility] prior to his scan.

## 2013-07-26 ENCOUNTER — Ambulatory Visit
Admission: RE | Admit: 2013-07-26 | Discharge: 2013-07-26 | Disposition: A | Discharge status: Home / Self Care | Payer: BC Managed Care – PPO | Source: Ambulatory Visit | Attending: General Surgery | Admitting: General Surgery

## 2013-07-26 ENCOUNTER — Other Ambulatory Visit (INDEPENDENT_AMBULATORY_CARE_PROVIDER_SITE_OTHER): Payer: Self-pay | Admitting: General Surgery

## 2013-07-26 DIAGNOSIS — M549 Dorsalgia, unspecified: Secondary | ICD-10-CM

## 2013-07-26 DIAGNOSIS — Z9049 Acquired absence of other specified parts of digestive tract: Secondary | ICD-10-CM

## 2013-07-26 DIAGNOSIS — Z86711 Personal history of pulmonary embolism: Secondary | ICD-10-CM

## 2013-07-26 LAB — COMPREHENSIVE METABOLIC PANEL
ALT: 39 U/L (ref 0–53)
AST: 30 U/L (ref 0–37)
Alkaline Phosphatase: 60 U/L (ref 39–117)
Sodium: 139 mEq/L (ref 135–145)
Total Bilirubin: 1.1 mg/dL (ref 0.3–1.2)
Total Protein: 6.7 g/dL (ref 6.0–8.3)

## 2013-07-26 MED ORDER — IOHEXOL 300 MG/ML  SOLN
60.0000 mL | Freq: Once | INTRAMUSCULAR | Status: AC | PRN
  Administered 2013-07-26: 60 mL via INTRAVENOUS

## 2013-07-26 NOTE — Telephone Encounter (Signed)
Patient called back and was given message to go by [facility] as well.  Patient states understanding and agreeable at this time.

## 2013-07-27 ENCOUNTER — Encounter (INDEPENDENT_AMBULATORY_CARE_PROVIDER_SITE_OTHER): Payer: Self-pay | Admitting: General Surgery

## 2013-07-27 NOTE — Progress Notes (Signed)
Patient ID: Allen Whitney, male   DOB: 25-Jul-1961, 52 y.o.   MRN: [id number] CT scan demonstrates a 3 cm fluid collection in the gallbladder fossa which could be post op vs. Abscess.  In speaking with him, he has no fever or chills.  The back pain improves when he stretches.  I informed him of the results of the CT.  Unless he develops clinical signs of infection, will not start antibiotics as this could be normal post op changes.  I asked him to call us if the pain worsened or he had a fever.  He would need to be started on Augmentin at that time.

## 2013-07-28 ENCOUNTER — Telehealth (INDEPENDENT_AMBULATORY_CARE_PROVIDER_SITE_OTHER): Payer: Self-pay

## 2013-07-28 NOTE — Telephone Encounter (Signed)
The pt called to report over last night and today his temperature has been 99-100.4.  He has had some chills.  He still has the discomfort across his back.  He was told to let Dr Abbey Chatters know.  He also wonders what he can take for his gout for pain while he is on Xarelto and soon to be antibiotics.  He uses the [facility] on [address] in [place].

## 2013-07-28 NOTE — Telephone Encounter (Signed)
I notified the Allen Whitney of Dr Maris Berger order for antibiotics.  He also wants him to start probiotics.  He needs to schedule an appointment in a week or 2.  Allen Whitney is aware and an appointment is scheduled for 10/17.  I called the Augmentin in to [facility] in [place].

## 2013-07-28 NOTE — Telephone Encounter (Signed)
Start him on Augmentin 875 mg po bid.  Dispense #20, Refill = 1.

## 2013-08-11 ENCOUNTER — Encounter (INDEPENDENT_AMBULATORY_CARE_PROVIDER_SITE_OTHER): Payer: Self-pay | Admitting: General Surgery

## 2013-08-11 ENCOUNTER — Ambulatory Visit (INDEPENDENT_AMBULATORY_CARE_PROVIDER_SITE_OTHER): Payer: BC Managed Care – PPO | Admitting: General Surgery

## 2013-08-11 VITALS — BP 170/100 | HR 100 | Temp 98.6°F | Resp 15 | Ht 71.0 in | Wt 231.6 lb

## 2013-08-11 DIAGNOSIS — Z4889 Encounter for other specified surgical aftercare: Secondary | ICD-10-CM

## 2013-08-11 NOTE — Progress Notes (Signed)
Procedure:  Laparoscopic cholecystectomy and umbilical hernia repair  Date:  07/01/2013  Pathology:  Acute and chronic cholecystitis with cholelithiasis  History:  He is here for Another postoperative visit. He developed some pain in the right back and low-grade fever. Prior to this side had a CT scan because of the discomfort and it showed a small fluid collection in the gallbladder fossa. Once he started running fever I started him on oral antibiotics. He is no longer having fever. The pain is getting better. He is now taken all the antibiotics. Exam: General- Is in NAD. Abdomen-soft, incisions are clean, intact, and solid. No tenderness is present. Assessment:  Post cholecystectomy for acute and chronic cholecystitis. He also had a primary umbilical hernia repair. His course was complicated by pulmonary embolism. He'll need to be on anticoagulation likely for 6 months. He had mild acute renal insufficiency with his last creatinine coming down to 1.36.  Had a small abscess in the gallbladder fossa which appears to be adequately treated with oral antibiotics.  Plan:  Low fat diet. Activities as tolerated. He does have bilateral inguinal hernias but I told him we would need to wait  until he is off his anticoagulation treatment before considering repair.  I told him to call us back if he started having fever and increasing pain in the back. He would need a repeat CT scan at the time if that occurred.

## 2013-08-11 NOTE — Patient Instructions (Signed)
Stay on a low-fat diet. Activities as tolerated.

## 2013-08-31 ENCOUNTER — Other Ambulatory Visit: Payer: Self-pay

## 2013-09-13 ENCOUNTER — Encounter: Payer: Self-pay | Admitting: Cardiology

## 2013-09-15 ENCOUNTER — Encounter: Payer: Self-pay | Admitting: Cardiology

## 2013-09-15 ENCOUNTER — Ambulatory Visit (INDEPENDENT_AMBULATORY_CARE_PROVIDER_SITE_OTHER): Payer: BC Managed Care – PPO | Admitting: Cardiology

## 2013-09-15 VITALS — BP 152/94 | HR 90 | Ht 71.0 in | Wt 238.0 lb

## 2013-09-15 DIAGNOSIS — G4733 Obstructive sleep apnea (adult) (pediatric): Secondary | ICD-10-CM

## 2013-09-15 DIAGNOSIS — R030 Elevated blood-pressure reading, without diagnosis of hypertension: Secondary | ICD-10-CM

## 2013-09-15 DIAGNOSIS — E669 Obesity, unspecified: Secondary | ICD-10-CM

## 2013-09-15 NOTE — Patient Instructions (Addendum)
Your physician recommends that you continue on your current medications as directed. Please refer to the Current Medication list given to you today.  Please take your BP daily for one week and call us with the results.   Your physician wants you to follow-up in: 6 Months with Dr. Sherlyn Lick will receive a reminder letter in the mail two months in advance. If you don't receive a letter, please call our office to schedule the follow-up appointment.

## 2013-09-15 NOTE — Progress Notes (Signed)
  [address] [place], [place]  [address] Phone: [phone] Fax:  [phone]  Date:  09/15/2013   ID:  Allen Whitney, DOB 1961-04-10, MRN [id number]  PCP:   [name], MD  Sleep Medicine:  Armanda Magic, MD   History of Present Illness: Allen Whitney is a 52 y.o. male with a history of OSA who presents today for followup.  He is doing well.  He tolerates his CPAP well.  He has no problems with the nasal pillow mask with chin strap and feels the pressure is adequate.  For the most part he feels rested in the am and has no daytime sleepiness.  His d/l today showed an AHI of 0.7/hr on 14cm H2O and 98% compliance in using more than 4 hours nightly.   Wt Readings from Last 3 Encounters:  09/15/13 238 lb (107.956 kg)  08/11/13 231 lb 9.6 oz (105.053 kg)  07/12/13 225 lb (102.059 kg)     Past Medical History  Diagnosis Date  . Sleep apnea     uses CPAP at night  . OSA on CPAP 07/07/2013  . Renal insufficiency, mild 07/07/2013  . Gout attack 07/07/2013    Current Outpatient Prescriptions  Medication Sig Dispense Refill  . acetaminophen (TYLENOL) 325 MG tablet Take 2 tablets (650 mg total) by mouth every 6 (six) hours as needed for pain.      . Multiple Vitamins-Minerals (MULTIVITAMIN WITH MINERALS) tablet Take 1 tablet by mouth daily.      . Rivaroxaban (XARELTO) 20 MG TABS tablet Take 1 tablet (20 mg total) by mouth daily. Starting 10/3, take 1 tab Daily with evening meal  30 tablet  0  . Rivaroxaban (XARELTO) 15 MG TABS tablet Take 1 tablet (15 mg total) by mouth 2 (two) times daily with a meal. For 3 weeks till 10/2  42 tablet  0   No current facility-administered medications for this visit.    Allergies:   No Known Allergies  Social History:  The patient  reports that he has never smoked. He does not have any smokeless tobacco history on file. He reports that he does not drink alcohol or use illicit drugs.   Family History:  The patient's family history is not  on file.   ROS:  Please see the history of present illness.      All other systems reviewed and negative.   PHYSICAL EXAM: VS:  BP 152/94  Pulse 90  Ht 5\' 11"  (1.803 m)  Wt 238 lb (107.956 kg)  BMI 33.21 kg/m2 Well nourished, well developed, in no acute distress HEENT: normal Neck: no JVD Cardiac:  normal S1, S2; RRR; no murmur Lungs:  clear to auscultation bilaterally, no wheezing, rhonchi or rales Abd: soft, nontender, no hepatomegaly Ext: trace edema Skin: warm and dry Neuro:  CNs 2-12 intact, no focal abnormalities noted       ASSESSMENT AND PLAN:  1. OSA on CPAP and tolerating well  - continue current CPAP settings 2.  Mildly elevated BP with no history of HTN  - I have asked him to check his BP daily for a week and call with the results 3.  Obesity  - I have encouraged him to get into an exercise program and diet program.  I have also encouraged him to avoid table salt.  Followup with me in 6 months  Signed, Armanda Magic, MD 09/15/2013 11:24 AM

## 2013-09-25 ENCOUNTER — Telehealth: Payer: Self-pay | Admitting: Cardiology

## 2013-09-25 NOTE — Telephone Encounter (Signed)
To Dr Turner to review 

## 2013-09-25 NOTE — Telephone Encounter (Signed)
New message    Pt has bp readings for 1wk to give to nurse

## 2013-09-25 NOTE — Telephone Encounter (Signed)
BP are in range except for the one on the 22nd.  Please continue on current medical regimen

## 2013-09-25 NOTE — Telephone Encounter (Signed)
New message BP readings  09/16/13   188/82 09/17/13    136/82 09/18/13     138/86 09/19/13      126/84 09/20/13     140/88 09/21/13      126/80 09/22/13      144/84 09/23/13      142/86  BP readings over the pas week, please call and advise.

## 2013-09-25 NOTE — Telephone Encounter (Signed)
PT is aware.

## 2013-09-25 NOTE — Telephone Encounter (Signed)
LVM for pt to return call with BP readings.

## 2013-10-18 ENCOUNTER — Encounter: Payer: Self-pay | Admitting: Cardiology

## 2013-12-18 ENCOUNTER — Telehealth: Payer: Self-pay | Admitting: *Deleted

## 2013-12-18 NOTE — Telephone Encounter (Signed)
The fax number is [phone]. Thanks, MI

## 2013-12-18 NOTE — Telephone Encounter (Signed)
Ok with order for CPAP supplies

## 2013-12-18 NOTE — Telephone Encounter (Signed)
TO Dr Mayford Knife to ok this rx. I will then write out and have you sign if you are ok with the order.

## 2013-12-18 NOTE — Telephone Encounter (Signed)
Patient needs rx for cpap supplies [facility]. He will call back with the fax number in case you dont have it.

## 2013-12-19 ENCOUNTER — Other Ambulatory Visit: Payer: Self-pay | Admitting: General Surgery

## 2013-12-19 DIAGNOSIS — G4733 Obstructive sleep apnea (adult) (pediatric): Secondary | ICD-10-CM

## 2013-12-19 NOTE — Telephone Encounter (Signed)
Order in [facility] and [facility] aware

## 2014-03-15 ENCOUNTER — Encounter: Payer: Self-pay | Admitting: Cardiology

## 2014-03-15 ENCOUNTER — Ambulatory Visit (INDEPENDENT_AMBULATORY_CARE_PROVIDER_SITE_OTHER): Payer: BC Managed Care – PPO | Admitting: Cardiology

## 2014-03-15 VITALS — BP 124/76 | HR 76 | Ht 71.0 in | Wt 232.0 lb

## 2014-03-15 DIAGNOSIS — Z9989 Dependence on other enabling machines and devices: Principal | ICD-10-CM

## 2014-03-15 DIAGNOSIS — G4733 Obstructive sleep apnea (adult) (pediatric): Secondary | ICD-10-CM

## 2014-03-15 DIAGNOSIS — E669 Obesity, unspecified: Secondary | ICD-10-CM

## 2014-03-15 DIAGNOSIS — Z0389 Encounter for observation for other suspected diseases and conditions ruled out: Secondary | ICD-10-CM

## 2014-03-15 NOTE — Patient Instructions (Signed)
Your physician wants you to follow-up in: 6 months with Dr. Turner. You will receive a reminder letter in the mail two months in advance. If you don't receive a letter, please call our office to schedule the follow-up appointment.  

## 2014-03-15 NOTE — Progress Notes (Signed)
  [address] [place], [place]  [address] Phone: [phone] Fax:  [phone]  Date:  03/15/2014   ID:  Allen Whitney, DOB Dec 11, 1960, MRN [id number]  PCP:   [name], MD  Sleep Medicine:  Armanda Magic, MD     History of Present Illness: Allen Whitney is a 53 y.o. Allen with a history of OSA who presents today for followup. He is doing well. He tolerates his CPAP well. He has no problems with the nasal pillow mask with chin strap and feels the pressure is adequate. For the most part he feels rested in the am but not as well as he did when he started and has no daytime sleepiness. His d/l today showed an AHI of 0.8/hr on 14cm H2O and 98% compliance in using more than 4 hours nightly.  He is trying to get back into an exercise program.    Wt Readings from Last 3 Encounters:  03/15/14 232 lb (105.235 kg)  09/15/13 238 lb (107.956 kg)  08/11/13 231 lb 9.6 oz (105.053 kg)     Past Medical History  Diagnosis Date  . Sleep apnea     uses CPAP at night  . OSA on CPAP 07/07/2013  . Renal insufficiency, mild 07/07/2013  . Gout attack 07/07/2013    Current Outpatient Prescriptions  Medication Sig Dispense Refill  . acetaminophen (TYLENOL) 325 MG tablet Take 2 tablets (650 mg total) by mouth every 6 (six) hours as needed for pain.      Marland Kitchen COLCRYS 0.6 MG tablet       . meloxicam (MOBIC) 15 MG tablet       . Multiple Vitamins-Minerals (MULTIVITAMIN WITH MINERALS) tablet Take 1 tablet by mouth daily.       No current facility-administered medications for this visit.    Allergies:   No Known Allergies  Social History:  The patient  reports that he has never smoked. He does not have any smokeless tobacco history on file. He reports that he does not drink alcohol or use illicit drugs.   Family History:  The patient's family history is not on file.   ROS:  Please see the history of present illness.      All other systems reviewed and negative.   PHYSICAL EXAM: VS:  BP  124/76  Pulse 76  Ht 5\' 11"  (1.803 m)  Wt 232 lb (105.235 kg)  BMI 32.37 kg/m2 Well nourished, well developed, in no acute distress HEENT: normal Neck: no JVD Cardiac:  normal S1, S2; RRR; no murmur Lungs:  clear to auscultation bilaterally, no wheezing, rhonchi or rales Abd: soft, nontender, no hepatomegaly Ext: no edema Skin: warm and dry Neuro:  CNs 2-12 intact, no focal abnormalities noted  EKG:  NSR with no ST changes  ASSESSMENT AND PLAN:  1.  OSA on CPAP and tolerating well.  His download today showed an AHI of 0.8/hr on 14cm H2O and 99% compliance in using more than 4 hours nightly. - continue current CPAP settings  2. Obesity  - I have encouraged him to follow a low fat diet and exercise  Followup with me in 6 months       Signed, Armanda Magic, MD 03/15/2014 4:31 PM

## 2014-03-29 ENCOUNTER — Encounter: Payer: Self-pay | Admitting: Cardiology

## 2014-03-31 DIAGNOSIS — M109 Gout, unspecified: Secondary | ICD-10-CM | POA: Insufficient documentation

## 2014-03-31 DIAGNOSIS — G4733 Obstructive sleep apnea (adult) (pediatric): Secondary | ICD-10-CM | POA: Insufficient documentation

## 2014-03-31 DIAGNOSIS — K5732 Diverticulitis of large intestine without perforation or abscess without bleeding: Secondary | ICD-10-CM | POA: Insufficient documentation

## 2014-03-31 NOTE — ED Notes (Signed)
Pt. reports left inguinal pain , fever , mid back pain  onset today .

## 2014-04-01 ENCOUNTER — Emergency Department (HOSPITAL_COMMUNITY): Payer: BC Managed Care – PPO

## 2014-04-01 ENCOUNTER — Encounter (HOSPITAL_COMMUNITY): Payer: Self-pay | Admitting: Emergency Medicine

## 2014-04-01 ENCOUNTER — Emergency Department (HOSPITAL_COMMUNITY)
Admission: EM | Admit: 2014-04-01 | Discharge: 2014-04-01 | Disposition: A | Discharge status: Home / Self Care | Payer: BC Managed Care – PPO | Attending: Emergency Medicine | Admitting: Emergency Medicine

## 2014-04-01 DIAGNOSIS — K5792 Diverticulitis of intestine, part unspecified, without perforation or abscess without bleeding: Secondary | ICD-10-CM

## 2014-04-01 LAB — URINALYSIS, ROUTINE W REFLEX MICROSCOPIC
BILIRUBIN URINE: NEGATIVE
GLUCOSE, UA: NEGATIVE mg/dL
Hgb urine dipstick: NEGATIVE
KETONES UR: NEGATIVE mg/dL
Leukocytes, UA: NEGATIVE
Nitrite: NEGATIVE
PH: 5.5 (ref 5.0–8.0)
PROTEIN: NEGATIVE mg/dL
Specific Gravity, Urine: 1.023 (ref 1.005–1.030)
Urobilinogen, UA: 0.2 mg/dL (ref 0.0–1.0)

## 2014-04-01 LAB — CBC WITH DIFFERENTIAL/PLATELET
BASOS ABS: 0.1 10*3/uL (ref 0.0–0.1)
BASOS PCT: 1 % (ref 0–1)
EOS ABS: 0.6 10*3/uL (ref 0.0–0.7)
Eosinophils Relative: 7 % — ABNORMAL HIGH (ref 0–5)
HEMATOCRIT: 38.8 % — AB (ref 39.0–52.0)
HEMOGLOBIN: 13 g/dL (ref 13.0–17.0)
LYMPHS PCT: 17 % (ref 12–46)
Lymphs Abs: 1.5 10*3/uL (ref 0.7–4.0)
MCH: 30.9 pg (ref 26.0–34.0)
MCHC: 33.5 g/dL (ref 30.0–36.0)
MCV: 92.2 fL (ref 78.0–100.0)
MONOS PCT: 9 % (ref 3–12)
Monocytes Absolute: 0.8 10*3/uL (ref 0.1–1.0)
NEUTROS ABS: 5.8 10*3/uL (ref 1.7–7.7)
NEUTROS PCT: 66 % (ref 43–77)
Platelets: 216 10*3/uL (ref 150–400)
RBC: 4.21 MIL/uL — AB (ref 4.22–5.81)
RDW: 13.8 % (ref 11.5–15.5)
WBC: 8.8 10*3/uL (ref 4.0–10.5)

## 2014-04-01 LAB — COMPREHENSIVE METABOLIC PANEL
ALBUMIN: 3.7 g/dL (ref 3.5–5.2)
ALK PHOS: 57 U/L (ref 39–117)
ALT: 28 U/L (ref 0–53)
AST: 28 U/L (ref 0–37)
BILIRUBIN TOTAL: 0.9 mg/dL (ref 0.3–1.2)
BUN: 16 mg/dL (ref 6–23)
CHLORIDE: 101 meq/L (ref 96–112)
CO2: 30 mEq/L (ref 19–32)
Calcium: 9.3 mg/dL (ref 8.4–10.5)
Creatinine, Ser: 1.23 mg/dL (ref 0.50–1.35)
GFR calc Af Amer: 75 mL/min — ABNORMAL LOW (ref >90)
GFR calc non Af Amer: 65 mL/min — ABNORMAL LOW (ref >90)
GLUCOSE: 125 mg/dL — AB (ref 70–99)
POTASSIUM: 4.3 meq/L (ref 3.7–5.3)
SODIUM: 142 meq/L (ref 137–147)
TOTAL PROTEIN: 6.6 g/dL (ref 6.0–8.3)

## 2014-04-01 MED ORDER — METRONIDAZOLE 500 MG PO TABS: 500.0000 mg | ORAL_TABLET | Freq: Two times a day (BID) | ORAL | Status: DC

## 2014-04-01 MED ORDER — METRONIDAZOLE IN NACL 5-0.79 MG/ML-% IV SOLN
500.0000 mg | Freq: Once | INTRAVENOUS | Status: AC
  Administered 2014-04-01: 500 mg via INTRAVENOUS
  Filled 2014-04-01: qty 100

## 2014-04-01 MED ORDER — CIPROFLOXACIN HCL 500 MG PO TABS
500.0000 mg | ORAL_TABLET | Freq: Once | ORAL | Status: AC
  Administered 2014-04-01: 500 mg via ORAL
  Filled 2014-04-01: qty 1

## 2014-04-01 MED ORDER — SODIUM CHLORIDE 0.9 % IV BOLUS (SEPSIS)
1000.0000 mL | Freq: Once | INTRAVENOUS | Status: AC
  Administered 2014-04-01: 1000 mL via INTRAVENOUS

## 2014-04-01 MED ORDER — TRAMADOL HCL 50 MG PO TABS: 50.0000 mg | ORAL_TABLET | Freq: Four times a day (QID) | ORAL | Status: DC | PRN

## 2014-04-01 MED ORDER — CIPROFLOXACIN HCL 500 MG PO TABS: 500.0000 mg | ORAL_TABLET | Freq: Two times a day (BID) | ORAL | Status: DC

## 2014-04-01 MED ORDER — IOHEXOL 300 MG/ML  SOLN
100.0000 mL | Freq: Once | INTRAMUSCULAR | Status: AC | PRN
  Administered 2014-04-01: 100 mL via INTRAVENOUS

## 2014-04-01 MED ORDER — IOHEXOL 300 MG/ML  SOLN: 25.0000 mL | Freq: Once | INTRAMUSCULAR | Status: DC | PRN

## 2014-04-01 MED ORDER — MORPHINE SULFATE 4 MG/ML IJ SOLN
4.0000 mg | Freq: Once | INTRAMUSCULAR | Status: DC
  Filled 2014-04-01: qty 1

## 2014-04-01 NOTE — ED Notes (Signed)
Pt reports onset of fever this evening. Pt states he took Ibuprofen at 2200.

## 2014-04-01 NOTE — ED Notes (Signed)
Pt alert, NAD, calm, interactive, resps e/u, skin W&D, denies pain while sitting still at this time, family at [facility], pt to CT.

## 2014-04-01 NOTE — ED Provider Notes (Signed)
CSN: [id number]     Arrival date & time 03/31/14  2343 History   First MD Initiated Contact with Patient 04/01/14 0201     No chief complaint on file.    (Consider location/radiation/quality/duration/timing/severity/associated sxs/prior Treatment) HPI 53 yo man with approximately 24 hrs of LLQ abdominal pain  with sneezing, couging or bending at the waist. Also worse with walking and driving over bumps in the road. No history of similar sx.Marland Kitchen He is pain free at rest.  Pain feels like "bulging". Patient denies nausea, vomiting and diarrhea. Last BM was about 12 hrs ago. Patient notes that he has a history of bilateral inguinal hernias. No GU sx. Pain is nonradiating. Patient is pain free at rest in the supine position.   Patient says he has had screening colonoscopy in the past and has been diagnosed with diverticulosis.   Past Medical History  Diagnosis Date  . Sleep apnea     uses CPAP at night  . OSA on CPAP 07/07/2013  . Renal insufficiency, mild 07/07/2013  . Gout attack 07/07/2013   Past Surgical History  Procedure Laterality Date  . Tonsillectomy    . Cholecystectomy N/A 07/01/2013    Procedure: LAPAROSCOPIC CHOLECYSTECTOMY;  Surgeon: Adolph Pollack, MD;  Location: [facility];  Service: General;  Laterality: N/A;  . Umbilical hernia repair N/A 07/01/2013    Procedure: HERNIA REPAIR UMBILICAL ADULT;  Surgeon: Adolph Pollack, MD;  Location: [facility];  Service: General;  Laterality: N/A;   No family history on file. History  Substance Use Topics  . Smoking status: Never Smoker   . Smokeless tobacco: Not on file  . Alcohol Use: No    Review of Systems Ten point review of symptoms performed and is negative with the exception of symptoms noted above.    Allergies  Review of patient's allergies indicates no known allergies.  Home Medications   Prior to Admission medications   Medication Sig Start Date End Date Taking? Authorizing Provider  acetaminophen (TYLENOL) 325 MG tablet  Take 2 tablets (650 mg total) by mouth every 6 (six) hours as needed for pain. 07/07/13   Sherrie George, PA-C  COLCRYS 0.6 MG tablet  02/14/14   Historical Provider, MD  meloxicam (MOBIC) 15 MG tablet  03/05/14   Historical Provider, MD  Multiple Vitamins-Minerals (MULTIVITAMIN WITH MINERALS) tablet Take 1 tablet by mouth daily.    Historical Provider, MD   BP 133/77  Pulse 75  Temp(Src) 98.5 F (36.9 C) (Oral)  Resp 18  Wt 237 lb 5 oz (107.644 kg)  SpO2 94% Physical Exam Gen: well developed and well nourished appearing Head: NCAT Eyes: PERL, EOMI Nose: no epistaixis or rhinorrhea Mouth/throat: mucosa is moist and pink Neck: supple, no stridor Lungs: CTA B, no wheezing, rhonchi or rales CV: RRR, no murmur, extremities appear well perfused.  Abd: soft, nondistended, no peritoneal signs, and tenderness to palpation of the left lower quadrant, no inguinal hernias appreciated.  Back: no ttp, no cva ttp Skin: healing lesion right upper chest wall, no rash, warm and dry Ext: normal to inspection, no dependent edema Neuro: CN ii-xii grossly intact, no focal deficits Psyche; normal affect,  calm and cooperative.  ED Course  Procedures (including critical care time) Labs Review  Results for orders placed during the hospital encounter of 04/01/14 (from the past 24 hour(s))  CBC WITH DIFFERENTIAL     Status: Abnormal   Collection Time    04/01/14 12:02 AM  Result Value Ref Range   WBC 8.8  4.0 - 10.5 K/uL   RBC 4.21 (*) 4.22 - 5.81 MIL/uL   Hemoglobin 13.0  13.0 - 17.0 g/dL   HCT 16.138.8 (*) 09.639.0 - 04.552.0 %   MCV 92.2  78.0 - 100.0 fL   MCH 30.9  26.0 - 34.0 pg   MCHC 33.5  30.0 - 36.0 g/dL   RDW 40.913.8  81.111.5 - 91.415.5 %   Platelets 216  150 - 400 K/uL   Neutrophils Relative % 66  43 - 77 %   Lymphocytes Relative 17  12 - 46 %   Monocytes Relative 9  3 - 12 %   Eosinophils Relative 7 (*) 0 - 5 %   Basophils Relative 1  0 - 1 %   Neutro Abs 5.8  1.7 - 7.7 K/uL   Lymphs Abs 1.5  0.7 -  4.0 K/uL   Monocytes Absolute 0.8  0.1 - 1.0 K/uL   Eosinophils Absolute 0.6  0.0 - 0.7 K/uL   Basophils Absolute 0.1  0.0 - 0.1 K/uL   RBC Morphology POLYCHROMASIA PRESENT    COMPREHENSIVE METABOLIC PANEL     Status: Abnormal   Collection Time    04/01/14 12:02 AM      Result Value Ref Range   Sodium 142  137 - 147 mEq/L   Potassium 4.3  3.7 - 5.3 mEq/L   Chloride 101  96 - 112 mEq/L   CO2 30  19 - 32 mEq/L   Glucose, Bld 125 (*) 70 - 99 mg/dL   BUN 16  6 - 23 mg/dL   Creatinine, Ser 7.821.23  0.50 - 1.35 mg/dL   Calcium 9.3  8.4 - 95.610.5 mg/dL   Total Protein 6.6  6.0 - 8.3 g/dL   Albumin 3.7  3.5 - 5.2 g/dL   AST 28  0 - 37 U/L   ALT 28  0 - 53 U/L   Alkaline Phosphatase 57  39 - 117 U/L   Total Bilirubin 0.9  0.3 - 1.2 mg/dL   GFR calc non Af Amer 65 (*) >90 mL/min   GFR calc Af Amer 75 (*) >90 mL/min  URINALYSIS, ROUTINE W REFLEX MICROSCOPIC     Status: None   Collection Time    04/01/14 12:08 AM      Result Value Ref Range   Color, Urine YELLOW  YELLOW   APPearance CLEAR  CLEAR   Specific Gravity, Urine 1.023  1.005 - 1.030   pH 5.5  5.0 - 8.0   Glucose, UA NEGATIVE  NEGATIVE mg/dL   Hgb urine dipstick NEGATIVE  NEGATIVE   Bilirubin Urine NEGATIVE  NEGATIVE   Ketones, ur NEGATIVE  NEGATIVE mg/dL   Protein, ur NEGATIVE  NEGATIVE mg/dL   Urobilinogen, UA 0.2  0.0 - 1.0 mg/dL   Nitrite NEGATIVE  NEGATIVE   Leukocytes, UA NEGATIVE  NEGATIVE   CT Abdomen Pelvis W Contrast (Final result)  Result time: 04/01/14 03:58:40    Final result by Rad Results In Interface (04/01/14 03:58:40)    Narrative:   CLINICAL DATA: Fever and abdominal pain.  EXAM: CT ABDOMEN AND PELVIS WITH CONTRAST  TECHNIQUE: Multidetector CT imaging of the abdomen and pelvis was performed using the standard protocol following bolus administration of intravenous contrast.  CONTRAST: 100mL OMNIPAQUE IOHEXOL 300 MG/ML SOLN  COMPARISON: 07/26/2013.  FINDINGS: The lung bases are clear. Coronary  artery calcifications are noted. The distal esophagus is unremarkable.  The liver  is unremarkable. No focal hepatic lesions or intrahepatic biliary dilatation. The gallbladder is surgically absent. No common bile duct dilatation. The pancreas is normal. The spleen is normal in size. No focal lesions. The adrenal glands and kidneys are normal.  The stomach, duodenum and small bowel are unremarkable. The colon demonstrates diffuse diverticulosis. This also acute uncomplicated diverticulitis involving the sigmoid colon descending colon junction region in the left pelvis. No abscess or free air. The appendix is normal. No mesenteric or retroperitoneal mass or adenopathy. The aorta and branch vessels are patent. No aneurysm or dissection.  The bladder, prostate gland and seminal vesicles are unremarkable. No pelvic mass or adenopathy. No free pelvic fluid collections. No inguinal mass or adenopathy. Prominent bilateral inguinal rings containing fat. There is a benign appearing lipoma in the left anterior thigh.  The bony structures are unremarkable.  IMPRESSION: Acute uncomplicated diverticulitis involving the upper sigmoid colon.   Electronically Signed By: Loralie Champagne M.D. On: 04/01/2014 03:58     MDM   DDX: colitis, UTI, diverticulitis, abdominal wall pain.   CT abd/pelvis for further evaluation of LLQ pain and ttp.   0428: CT scan confirms diverticulitis. We have treated with Cipro and Flagyl. Patient is stable for discharge with plan for abx and close outpatient follow up.     Brandt Loosen, MD 04/01/14 0430

## 2014-04-01 NOTE — Discharge Instructions (Signed)
Diverticulitis °A diverticulum is a small pouch or sac on the colon. Diverticulosis is the presence of these diverticula on the colon. Diverticulitis is the irritation (inflammation) or infection of diverticula. °CAUSES  °The colon and its diverticula contain bacteria. If food particles block the tiny opening to a diverticulum, the bacteria inside can grow and cause an increase in pressure. This leads to infection and inflammation and is called diverticulitis. °SYMPTOMS  °· Abdominal pain and tenderness. Usually, the pain is located on the left side of your abdomen. However, it could be located elsewhere. °· Fever. °· Bloating. °· Feeling sick to your stomach (nausea). °· Throwing up (vomiting). °· Abnormal stools. °DIAGNOSIS  °Your caregiver will take a history and perform a physical exam. Since many things can cause abdominal pain, other tests may be necessary. Tests may include: °· Blood tests. °· Urine tests. °· X-ray of the abdomen. °· CT scan of the abdomen. °Sometimes, surgery is needed to determine if diverticulitis or other conditions are causing your symptoms. °TREATMENT  °Most of the time, you can be treated without surgery. Treatment includes: °· Resting the bowels by only having liquids for a few days. As you improve, you will need to eat a low-fiber diet. °· Intravenous (IV) fluids if you are losing body fluids (dehydrated). °· Antibiotic medicines that treat infections may be given. °· Pain and nausea medicine, if needed. °· Surgery if the inflamed diverticulum has burst. °HOME CARE INSTRUCTIONS  °· Try a clear liquid diet (broth, tea, or water for as long as directed by your caregiver). You may then gradually begin a low-fiber diet as tolerated.  °A low-fiber diet is a diet with less than 10 grams of fiber. Choose the foods below to reduce fiber in the diet: °· White breads, cereals, rice, and pasta. °· Cooked fruits and vegetables or soft fresh fruits and vegetables without the skin. °· Ground or  well-cooked tender beef, ham, veal, lamb, pork, or poultry. °· Eggs and seafood. °· After your diverticulitis symptoms have improved, your caregiver may put you on a high-fiber diet. A high-fiber diet includes 14 grams of fiber for every 1000 calories consumed. For a standard 2000 calorie diet, you would need 28 grams of fiber. Follow these diet guidelines to help you increase the fiber in your diet. It is important to slowly increase the amount fiber in your diet to avoid gas, constipation, and bloating. °· Choose whole-grain breads, cereals, pasta, and brown rice. °· Choose fresh fruits and vegetables with the skin on. Do not overcook vegetables because the more vegetables are cooked, the more fiber is lost. °· Choose more nuts, seeds, legumes, dried peas, beans, and lentils. °· Look for food products that have greater than 3 grams of fiber per serving on the Nutrition Facts label. °· Take all medicine as directed by your caregiver. °· If your caregiver has given you a follow-up appointment, it is very important that you go. Not going could result in lasting (chronic) or permanent injury, pain, and disability. If there is any problem keeping the appointment, call to reschedule. °SEEK MEDICAL CARE IF:  °· Your pain does not improve. °· You have a hard time advancing your diet beyond clear liquids. °· Your bowel movements do not return to normal. °SEEK IMMEDIATE MEDICAL CARE IF:  °· Your pain becomes worse. °· You have an oral temperature above 102° F (38.9° C), not controlled by medicine. °· You have repeated vomiting. °· You have bloody or black, tarry stools. °·   Symptoms that brought you to your caregiver become worse or are not getting better. °MAKE SURE YOU:  °· Understand these instructions. °· Will watch your condition. °· Will get help right away if you are not doing well or get worse. °Document Released: 07/22/2005 Document Revised: 01/04/2012 Document Reviewed: 11/17/2010 °[employer]® Patient Information  ©2014 [employer]. ° °

## 2014-06-29 ENCOUNTER — Encounter: Payer: Self-pay | Admitting: Cardiology

## 2014-09-13 ENCOUNTER — Ambulatory Visit (INDEPENDENT_AMBULATORY_CARE_PROVIDER_SITE_OTHER): Payer: Private Health Insurance - Indemnity | Admitting: Cardiology

## 2014-09-13 ENCOUNTER — Encounter: Payer: Self-pay | Admitting: Cardiology

## 2014-09-13 VITALS — BP 134/78 | HR 74 | Ht 71.0 in | Wt 224.0 lb

## 2014-09-13 DIAGNOSIS — E669 Obesity, unspecified: Secondary | ICD-10-CM

## 2014-09-13 DIAGNOSIS — G4733 Obstructive sleep apnea (adult) (pediatric): Secondary | ICD-10-CM

## 2014-09-13 DIAGNOSIS — Z9989 Dependence on other enabling machines and devices: Principal | ICD-10-CM

## 2014-09-13 NOTE — Progress Notes (Signed)
  [address] [place], [place]  [address] Phone: [phone] Fax:  [phone]  Date:  09/13/2014   ID:  Draylon Febres, DOB 1961-03-14, MRN [id number]  PCP:   [name], MD  Cardiologist:  Armanda Magic, MD    History of Present Illness: Allen Whitney is a 53 y.o. male with a history of OSA who presents today for followup. He is doing well. He tolerates his CPAP well. He has no problems with the nasal pillow mask with chin strap and feels the pressure is adequate. He thinks that he is snoring again.  He says that the mask is leaking some.  For the most part he feels rested in the am but not as well as he did when he started and has no daytime sleepiness. His d/l today showed an AHI of 0.8/hr on 14cm H2O and 98% compliance in using more than 4 hours nightly. He has modified his diet and has lost 13 pounds.  His exercise is limited by a bed knee.  Wt Readings from Last 3 Encounters:  09/13/14 224 lb (101.606 kg)  03/31/14 237 lb 5 oz (107.644 kg)  03/15/14 232 lb (105.235 kg)     Past Medical History  Diagnosis Date  . Sleep apnea     uses CPAP at night  . OSA on CPAP 07/07/2013  . Renal insufficiency, mild 07/07/2013  . Gout attack 07/07/2013    Current Outpatient Prescriptions  Medication Sig Dispense Refill  . acetaminophen (TYLENOL) 325 MG tablet Take 2 tablets (650 mg total) by mouth every 6 (six) hours as needed for pain.    . meloxicam (MOBIC) 15 MG tablet Take 15 mg by mouth daily.    . Multiple Vitamins-Minerals (MULTIVITAMIN WITH MINERALS) tablet Take 1 tablet by mouth daily.    . traMADol (ULTRAM) 50 MG tablet Take 1 tablet (50 mg total) by mouth every 6 (six) hours as needed. 15 tablet 0   No current facility-administered medications for this visit.    Allergies:   No Known Allergies  Social History:  The patient  reports that he has never smoked. He does not have any smokeless tobacco history on file. He reports that he does not drink alcohol or use  illicit drugs.   Family History:  The patient's family history is not on file.   ROS:  Please see the history of present illness.      All other systems reviewed and negative.   PHYSICAL EXAM: VS:  BP 134/78 mmHg  Pulse 74  Ht 5\' 11"  (1.803 m)  Wt 224 lb (101.606 kg)  BMI 31.26 kg/m2 Well nourished, well developed, in no acute distress HEENT: normal Neck: no JVD Cardiac:  normal S1, S2; RRR; no murmur Lungs:  clear to auscultation bilaterally, no wheezing, rhonchi or rales Abd: soft, nontender, no hepatomegaly Ext: no edema Skin: warm and dry Neuro:  CNs 2-12 intact, no focal abnormalities noted  ASSESSMENT AND PLAN:  1. OSA on CPAP and tolerating well. His download today showed an AHI of 0.9hr on 14cm H2O and 100% compliance in using more than 4 hours nightly. - continue current CPAP settings  2. Obesity  - I have encouraged him to follow a low fat diet and exercise  Followup with me in 1 year   Signed, Armanda Magic, MD [facility] 09/13/2014 9:25 AM

## 2014-09-13 NOTE — Patient Instructions (Signed)
Your physician recommends that you continue on your current medications as directed. Please refer to the Current Medication list given to you today.  Your physician wants you to follow-up in: 1 year with Dr. Turner. You will receive a reminder letter in the mail two months in advance. If you don't receive a letter, please call our office to schedule the follow-up appointment.  

## 2014-09-19 IMAGING — US US ABDOMEN COMPLETE
1 series · 13 of 25 positions shown · non-contrast
Comparison: CT scan from yesterday.

CLINICAL DATA: Back pain with nausea constipation.

ABDOMEN ULTRASOUND
TECHNIQUE: Complete abdominal ultrasound examination was performed
including evaluation of the liver, gallbladder, bile ducts,
pancreas, kidneys, spleen, IVC, and abdominal aorta.

[Series 1: us abdomen complete · 0.39mm/px · 13 of 84 slices shown]
[im 1/84]
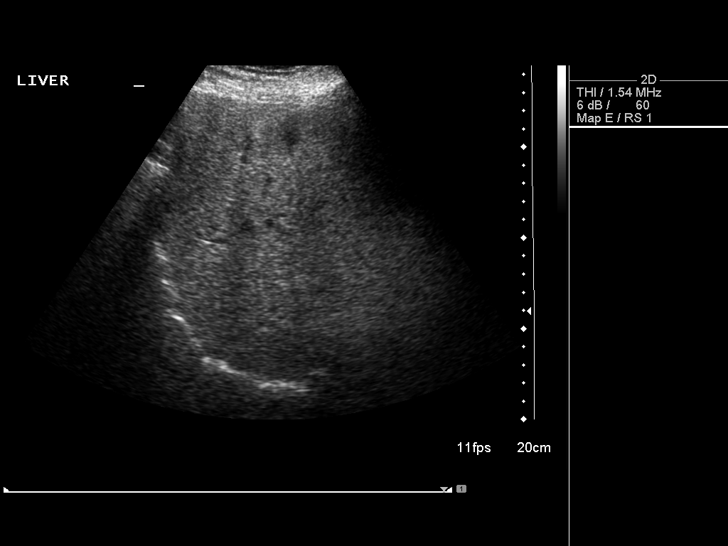
[im 7/84]
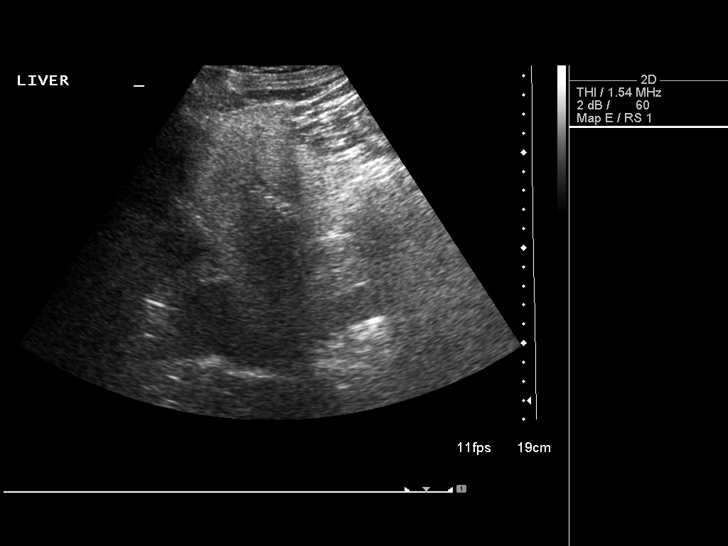
[im 14/84]
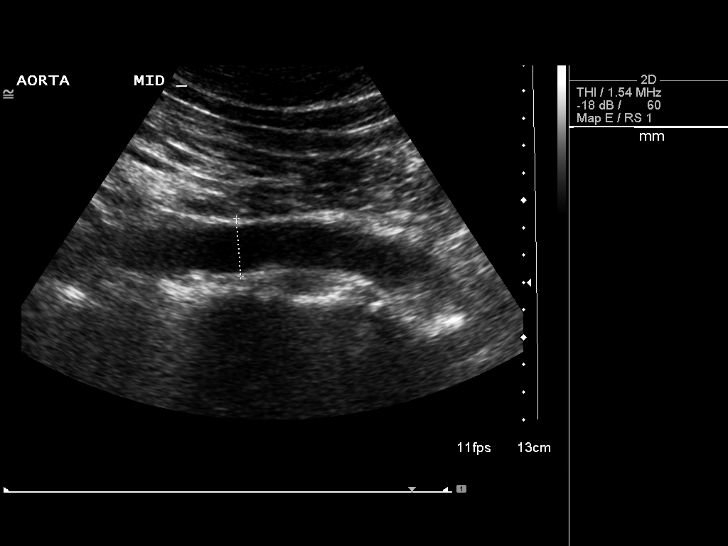
[im 21/84]
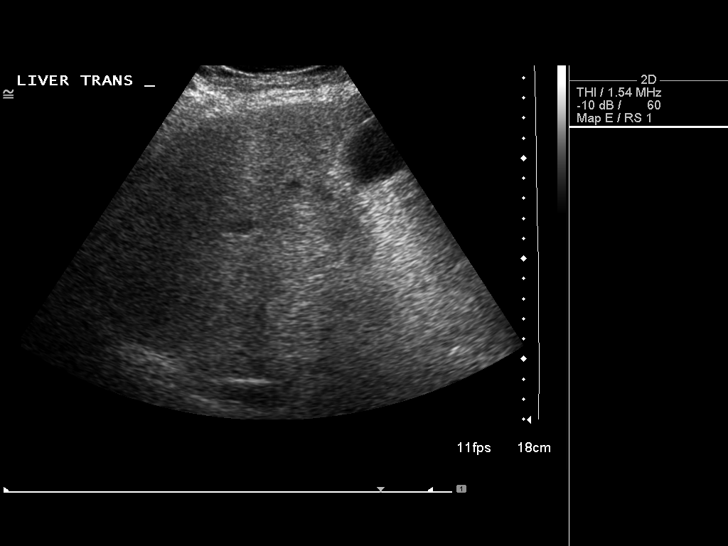
[im 28/84]
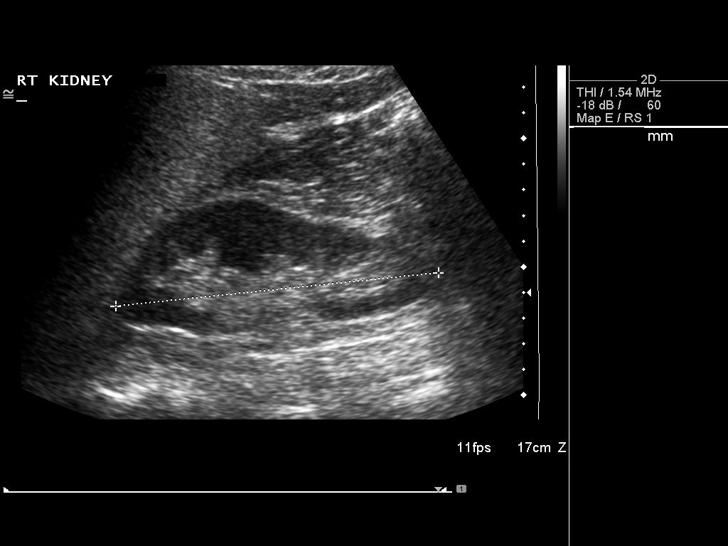
[im 35/84]
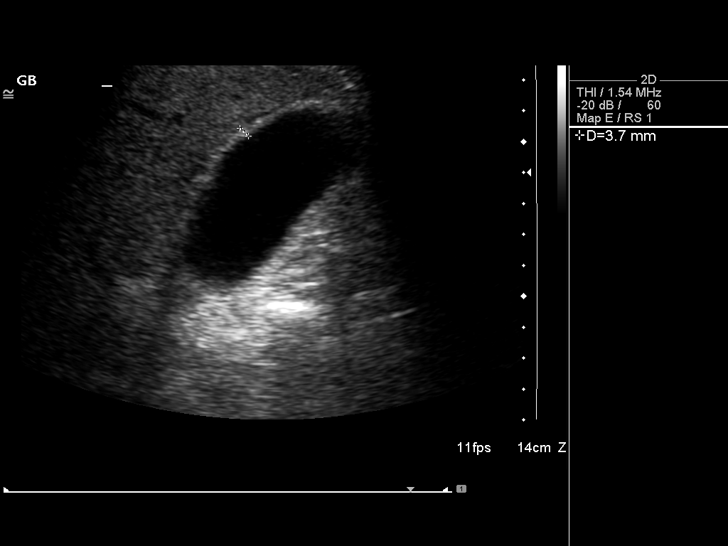
[im 42/84]
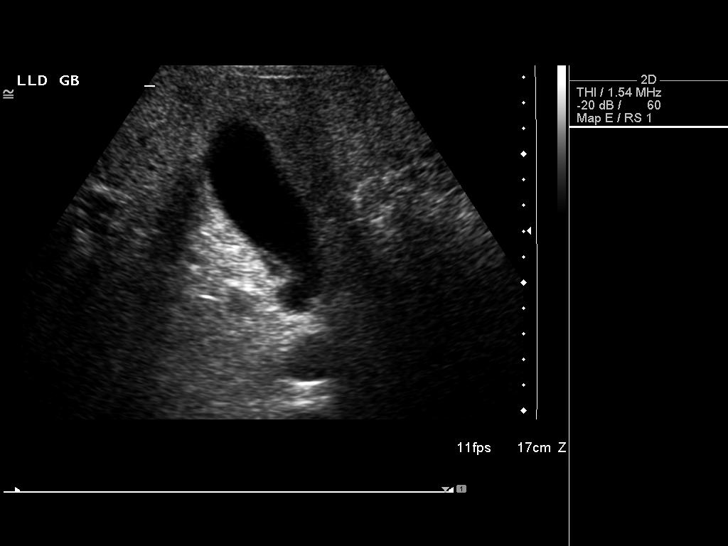
[im 49/84]
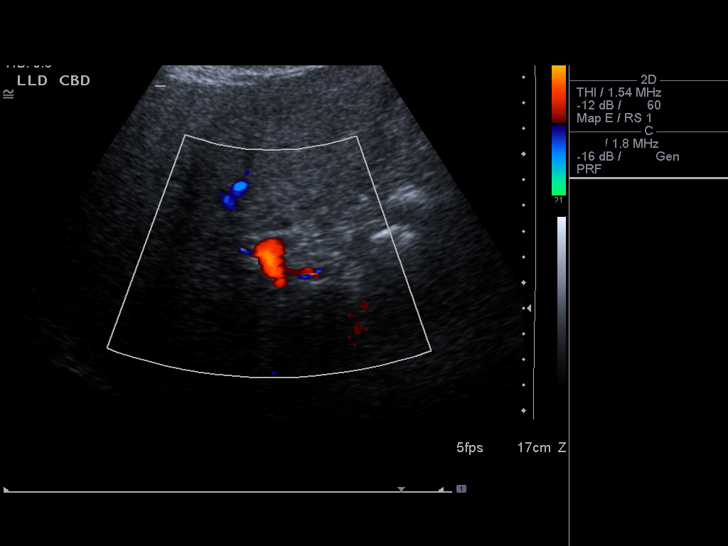
[im 56/84]
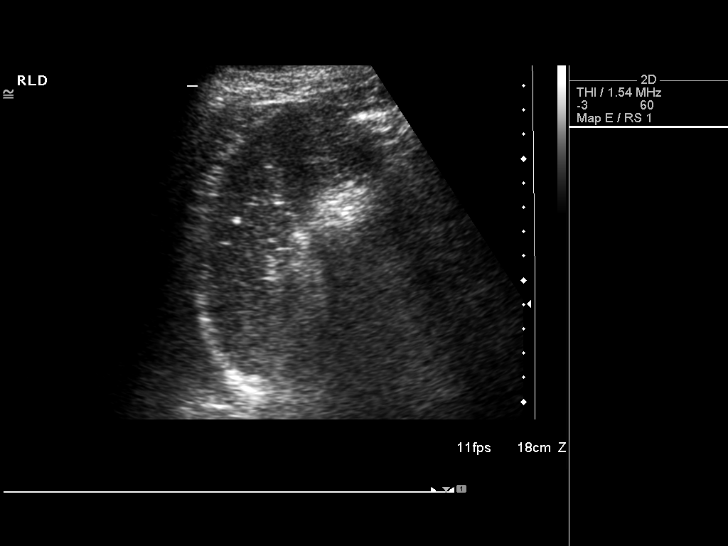
[im 63/84]
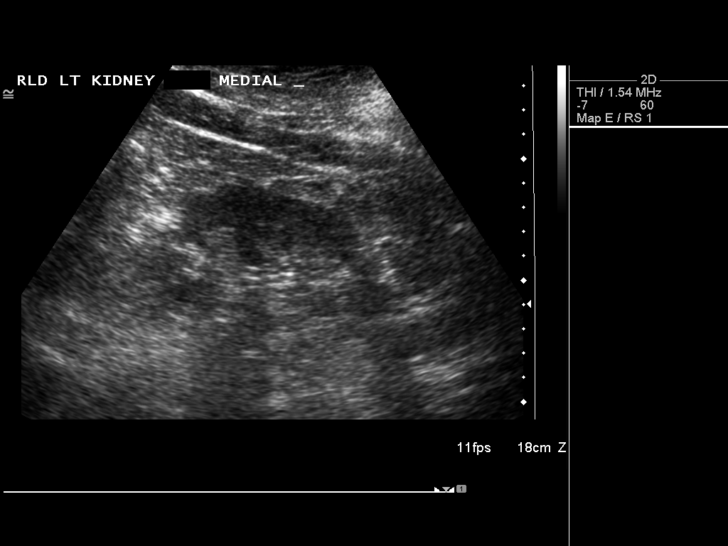
[im 70/84]
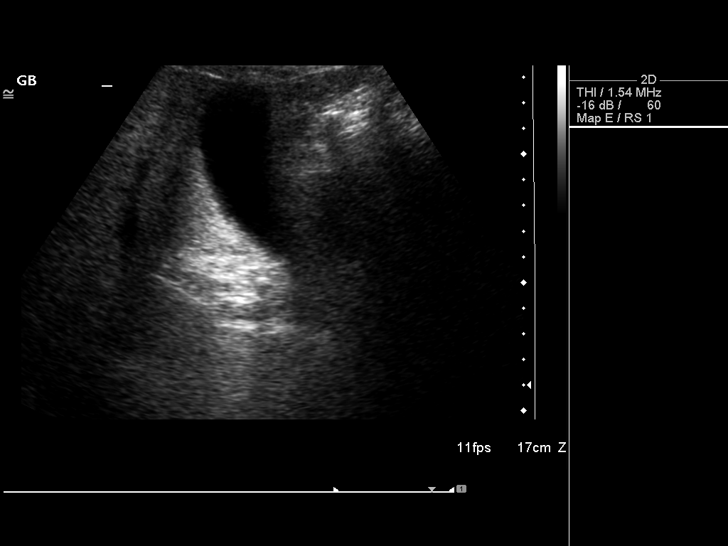
[im 77/84]
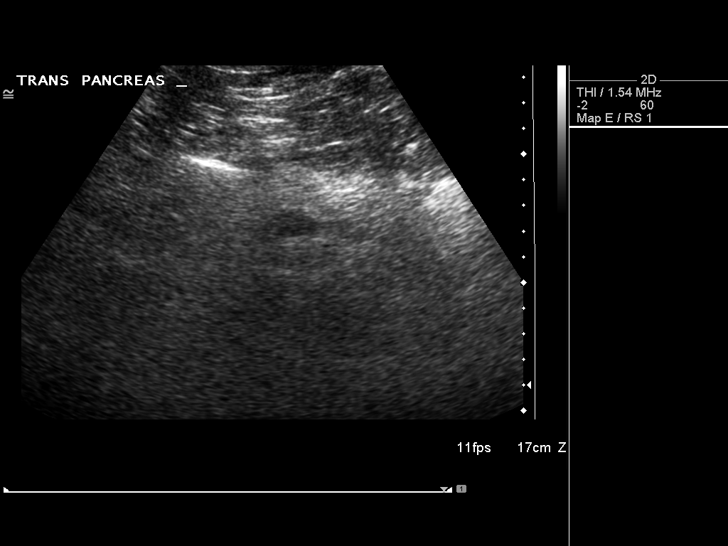
[im 84/84]
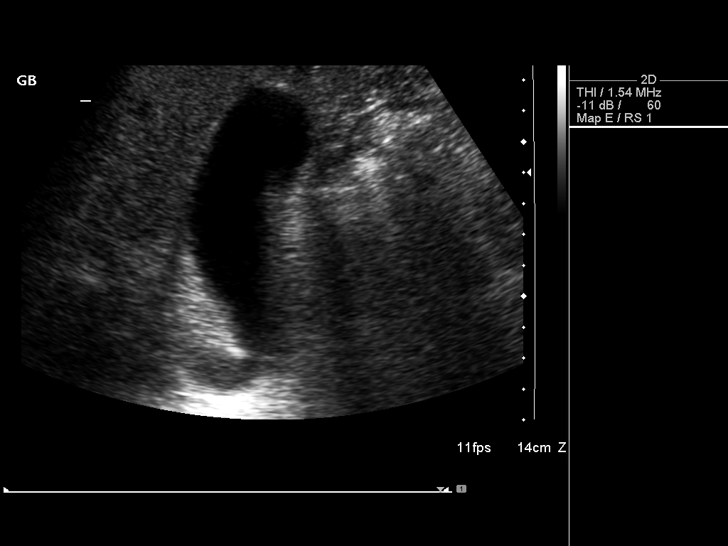

[13 of 25 positions shown; findings below may reference images not displayed]

FINDINGS: Gallbladder:  Several wall tiny echogenic foci in the neck of the
gallbladder suggests stones measuring in the six - 7 mm size range.
Gallbladder wall is mildly thickened, measuring 3-4 mm.  No
substantial pericholecystic fluid.  The sonographer reports no
sonographic Murphy's sign.

Common Bile Duct:  Poorly visualized secondary to poor acoustic
window.

Liver:  Echogenic parenchyma suggest fatty infiltration.  No
evidence for intrahepatic biliary duct dilatation.

IVC:  Normal.

Pancreas:  Poorly seen secondary overlying bowel gas.

Spleen:  Probable calcified granuloma within the parenchyma.

Right kidney:  13.3 cm in long axis.  Normal.

Left kidney:  12.1 cm in long axis.  Normal.

Abdominal Aorta:  Obscured by overlying bowel gas.
IMPRESSION: Gallstones with apparent mild gallbladder wall thickening. If the
clinical picture is equivocal for acute cholecystitis, nuclear
scintigraphy may prove helpful to further evaluate.

## 2014-09-26 ENCOUNTER — Encounter: Payer: Self-pay | Admitting: Cardiology

## 2014-11-08 ENCOUNTER — Encounter (HOSPITAL_COMMUNITY): Payer: Self-pay | Admitting: General Surgery

## 2015-04-27 ENCOUNTER — Encounter (HOSPITAL_COMMUNITY): Payer: Self-pay | Admitting: *Deleted

## 2015-04-27 ENCOUNTER — Inpatient Hospital Stay (HOSPITAL_COMMUNITY)
Admission: EM | Admit: 2015-04-27 | Discharge: 2015-04-30 | DRG: 392 | Disposition: A | Discharge status: Home / Self Care | Payer: Managed Care, Other (non HMO) | Attending: Internal Medicine | Admitting: Internal Medicine

## 2015-04-27 ENCOUNTER — Inpatient Hospital Stay (HOSPITAL_COMMUNITY): Payer: Managed Care, Other (non HMO)

## 2015-04-27 ENCOUNTER — Emergency Department (HOSPITAL_COMMUNITY): Payer: Managed Care, Other (non HMO)

## 2015-04-27 DIAGNOSIS — R911 Solitary pulmonary nodule: Secondary | ICD-10-CM | POA: Diagnosis present

## 2015-04-27 DIAGNOSIS — G4733 Obstructive sleep apnea (adult) (pediatric): Secondary | ICD-10-CM | POA: Diagnosis present

## 2015-04-27 DIAGNOSIS — R1032 Left lower quadrant pain: Secondary | ICD-10-CM | POA: Diagnosis present

## 2015-04-27 DIAGNOSIS — K5732 Diverticulitis of large intestine without perforation or abscess without bleeding: Secondary | ICD-10-CM

## 2015-04-27 DIAGNOSIS — M549 Dorsalgia, unspecified: Secondary | ICD-10-CM | POA: Diagnosis present

## 2015-04-27 DIAGNOSIS — Z9989 Dependence on other enabling machines and devices: Secondary | ICD-10-CM

## 2015-04-27 DIAGNOSIS — Z791 Long term (current) use of non-steroidal anti-inflammatories (NSAID): Secondary | ICD-10-CM

## 2015-04-27 DIAGNOSIS — K5792 Diverticulitis of intestine, part unspecified, without perforation or abscess without bleeding: Secondary | ICD-10-CM | POA: Diagnosis present

## 2015-04-27 DIAGNOSIS — M546 Pain in thoracic spine: Secondary | ICD-10-CM | POA: Diagnosis present

## 2015-04-27 DIAGNOSIS — R110 Nausea: Secondary | ICD-10-CM | POA: Diagnosis present

## 2015-04-27 DIAGNOSIS — Z79899 Other long term (current) drug therapy: Secondary | ICD-10-CM | POA: Diagnosis not present

## 2015-04-27 DIAGNOSIS — Z9049 Acquired absence of other specified parts of digestive tract: Secondary | ICD-10-CM | POA: Diagnosis present

## 2015-04-27 DIAGNOSIS — R7989 Other specified abnormal findings of blood chemistry: Secondary | ICD-10-CM

## 2015-04-27 DIAGNOSIS — Z86711 Personal history of pulmonary embolism: Secondary | ICD-10-CM

## 2015-04-27 DIAGNOSIS — R51 Headache: Secondary | ICD-10-CM | POA: Diagnosis present

## 2015-04-27 DIAGNOSIS — R109 Unspecified abdominal pain: Secondary | ICD-10-CM | POA: Diagnosis present

## 2015-04-27 HISTORY — DX: Diverticulitis of intestine, part unspecified, without perforation or abscess without bleeding: K57.92

## 2015-04-27 HISTORY — DX: Other pulmonary embolism without acute cor pulmonale: I26.99

## 2015-04-27 LAB — CBC WITH DIFFERENTIAL/PLATELET
BASOS PCT: 0 % (ref 0–1)
Basophils Absolute: 0 10*3/uL (ref 0.0–0.1)
EOS PCT: 1 % (ref 0–5)
Eosinophils Absolute: 0.1 10*3/uL (ref 0.0–0.7)
HCT: 37.3 % — ABNORMAL LOW (ref 39.0–52.0)
HEMOGLOBIN: 12.9 g/dL — AB (ref 13.0–17.0)
LYMPHS ABS: 1.9 10*3/uL (ref 0.7–4.0)
LYMPHS PCT: 13 % (ref 12–46)
MCH: 30.9 pg (ref 26.0–34.0)
MCHC: 34.6 g/dL (ref 30.0–36.0)
MCV: 89.4 fL (ref 78.0–100.0)
MONOS PCT: 10 % (ref 3–12)
Monocytes Absolute: 1.5 10*3/uL — ABNORMAL HIGH (ref 0.1–1.0)
NEUTROS ABS: 11.2 10*3/uL — AB (ref 1.7–7.7)
Neutrophils Relative %: 76 % (ref 43–77)
PLATELETS: 225 10*3/uL (ref 150–400)
RBC: 4.17 MIL/uL — AB (ref 4.22–5.81)
RDW: 13.5 % (ref 11.5–15.5)
WBC: 14.7 10*3/uL — AB (ref 4.0–10.5)

## 2015-04-27 LAB — URINALYSIS, ROUTINE W REFLEX MICROSCOPIC
Bilirubin Urine: NEGATIVE
Glucose, UA: NEGATIVE mg/dL
KETONES UR: NEGATIVE mg/dL
Leukocytes, UA: NEGATIVE
NITRITE: NEGATIVE
Protein, ur: NEGATIVE mg/dL
Urobilinogen, UA: 0.2 mg/dL (ref 0.0–1.0)
pH: 6 (ref 5.0–8.0)

## 2015-04-27 LAB — BASIC METABOLIC PANEL
ANION GAP: 9 (ref 5–15)
BUN: 17 mg/dL (ref 6–20)
CHLORIDE: 99 mmol/L — AB (ref 101–111)
CO2: 28 mmol/L (ref 22–32)
CREATININE: 1.22 mg/dL (ref 0.61–1.24)
Calcium: 9 mg/dL (ref 8.9–10.3)
GFR calc Af Amer: >60 mL/min (ref >60)
GFR calc non Af Amer: >60 mL/min (ref >60)
GLUCOSE: 91 mg/dL (ref 65–99)
Potassium: 3.8 mmol/L (ref 3.5–5.1)
SODIUM: 136 mmol/L (ref 135–145)

## 2015-04-27 LAB — C-REACTIVE PROTEIN: CRP: 9.6 mg/dL — ABNORMAL HIGH (ref <1.0)

## 2015-04-27 LAB — D-DIMER, QUANTITATIVE (NOT AT ARMC): D DIMER QUANT: 1.06 ug{FEU}/mL — AB (ref 0.00–0.48)

## 2015-04-27 LAB — LIPASE, BLOOD: LIPASE: 20 U/L — AB (ref 22–51)

## 2015-04-27 LAB — URINE MICROSCOPIC-ADD ON

## 2015-04-27 LAB — SEDIMENTATION RATE: SED RATE: 14 mm/h (ref 0–16)

## 2015-04-27 MED ORDER — ENOXAPARIN SODIUM 40 MG/0.4ML ~~LOC~~ SOLN
40.0000 mg | SUBCUTANEOUS | Status: DC
  Administered 2015-04-27 – 2015-04-29 (×3): 40 mg via SUBCUTANEOUS
  Filled 2015-04-27 (×3): qty 0.4

## 2015-04-27 MED ORDER — HYDROMORPHONE HCL 1 MG/ML IJ SOLN: 1.0000 mg | INTRAMUSCULAR | Status: DC | PRN

## 2015-04-27 MED ORDER — SODIUM CHLORIDE 0.9 % IV BOLUS (SEPSIS)
1000.0000 mL | Freq: Once | INTRAVENOUS | Status: AC
  Administered 2015-04-27: 1000 mL via INTRAVENOUS

## 2015-04-27 MED ORDER — IOHEXOL 300 MG/ML  SOLN
100.0000 mL | Freq: Once | INTRAMUSCULAR | Status: AC | PRN
  Administered 2015-04-27: 100 mL via INTRAVENOUS

## 2015-04-27 MED ORDER — ONDANSETRON HCL 4 MG/2ML IJ SOLN: 4.0000 mg | Freq: Four times a day (QID) | INTRAMUSCULAR | Status: DC | PRN

## 2015-04-27 MED ORDER — PIPERACILLIN-TAZOBACTAM 3.375 G IVPB 30 MIN: 3.3750 g | Freq: Once | INTRAVENOUS | Status: DC

## 2015-04-27 MED ORDER — ACETAMINOPHEN 325 MG PO TABS: 650.0000 mg | ORAL_TABLET | Freq: Four times a day (QID) | ORAL | Status: DC | PRN

## 2015-04-27 MED ORDER — ACETAMINOPHEN 650 MG RE SUPP: 650.0000 mg | Freq: Four times a day (QID) | RECTAL | Status: DC | PRN

## 2015-04-27 MED ORDER — IOHEXOL 350 MG/ML SOLN
80.0000 mL | Freq: Once | INTRAVENOUS | Status: AC | PRN
  Administered 2015-04-27: 80 mL via INTRAVENOUS

## 2015-04-27 MED ORDER — IOHEXOL 300 MG/ML  SOLN
25.0000 mL | Freq: Once | INTRAMUSCULAR | Status: AC | PRN
  Administered 2015-04-27: 25 mL via ORAL

## 2015-04-27 MED ORDER — SODIUM CHLORIDE 0.9 % IV SOLN
INTRAVENOUS | Status: DC
  Administered 2015-04-27 – 2015-04-28 (×2): via INTRAVENOUS

## 2015-04-27 MED ORDER — ONDANSETRON HCL 4 MG PO TABS: 4.0000 mg | ORAL_TABLET | Freq: Four times a day (QID) | ORAL | Status: DC | PRN

## 2015-04-27 MED ORDER — PIPERACILLIN-TAZOBACTAM 3.375 G IVPB
3.3750 g | Freq: Three times a day (TID) | INTRAVENOUS | Status: DC
Start: 2015-04-27 — End: 2015-04-30
  Administered 2015-04-27 – 2015-04-30 (×8): 3.375 g via INTRAVENOUS
  Filled 2015-04-27 (×10): qty 50

## 2015-04-27 NOTE — ED Notes (Signed)
The pt reports no pain at present.  He is waiting for c-t that is behind.  That info given to pt and family.

## 2015-04-27 NOTE — ED Notes (Signed)
The pt returned from c-t 

## 2015-04-27 NOTE — ED Notes (Signed)
To ct

## 2015-04-27 NOTE — ED Provider Notes (Signed)
CSN: [id number]     Arrival date & time 04/27/15  1150 History   First MD Initiated Contact with Patient 04/27/15 1241     Chief Complaint  Patient presents with  . Abdominal Pain  . Headache     (Consider location/radiation/quality/duration/timing/severity/associated sxs/prior Treatment) HPI   Pt p/w LLQ abdominal pain, diarrhea.  Was treated for diverticulitis 2 weeks ago with cipro and flagyl, was better until 2-3 days ago when he developed worsening pain and new diarrhea.  Has had associated headache and fatigue.  Temperature at home 99.4.  Denies CP, SOB, urinary symptoms.  Sent today from [facility], concern for diverticular abscess.      Past Medical History  Diagnosis Date  . Sleep apnea     uses CPAP at night  . OSA on CPAP 07/07/2013  . Renal insufficiency, mild 07/07/2013  . Gout attack 07/07/2013  . Diverticulitis   . PE (pulmonary embolism)     left lung   Past Surgical History  Procedure Laterality Date  . Tonsillectomy    . Cholecystectomy N/A 07/01/2013    Procedure: LAPAROSCOPIC CHOLECYSTECTOMY;  Surgeon: Adolph Pollack, MD;  Location: [facility];  Service: General;  Laterality: N/A;  . Umbilical hernia repair N/A 07/01/2013    Procedure: HERNIA REPAIR UMBILICAL ADULT;  Surgeon: Adolph Pollack, MD;  Location: [facility];  Service: General;  Laterality: N/A;   No family history on file. History  Substance Use Topics  . Smoking status: Never Smoker   . Smokeless tobacco: Not on file  . Alcohol Use: No    Review of Systems  All other systems reviewed and are negative.     Allergies  Review of patient's allergies indicates no known allergies.  Home Medications   Prior to Admission medications   Medication Sig Start Date End Date Taking? Authorizing Provider  acetaminophen (TYLENOL) 325 MG tablet Take 2 tablets (650 mg total) by mouth every 6 (six) hours as needed for pain. 07/07/13   Sherrie George, PA-C  meloxicam (MOBIC) 15 MG tablet Take 15  mg by mouth daily.    Historical Provider, MD  Multiple Vitamins-Minerals (MULTIVITAMIN WITH MINERALS) tablet Take 1 tablet by mouth daily.    Historical Provider, MD  traMADol (ULTRAM) 50 MG tablet Take 1 tablet (50 mg total) by mouth every 6 (six) hours as needed. 04/01/14   Brandt Loosen, MD   BP 149/73 mmHg  Pulse 84  Temp(Src) 98.6 F (37 C) (Oral)  Resp 18  SpO2 97% Physical Exam  Constitutional: He appears well-developed and well-nourished. No distress.  HENT:  Head: Normocephalic and atraumatic.  Neck: Neck supple.  Cardiovascular: Normal rate and regular rhythm.   Pulmonary/Chest: Effort normal and breath sounds normal. No respiratory distress. He has no wheezes. He has no rales.  Abdominal: Soft. He exhibits no distension and no mass. There is no tenderness. There is no rigidity, no rebound and no guarding.    Neurological: He is alert. He exhibits normal muscle tone.  Skin: He is not diaphoretic.  Nursing note and vitals reviewed.   ED Course  Procedures (including critical care time) Labs Review Labs Reviewed  CBC WITH DIFFERENTIAL/PLATELET - Abnormal; Notable for the following:    WBC 14.7 (*)    RBC 4.17 (*)    Hemoglobin 12.9 (*)    HCT 37.3 (*)    Neutro Abs 11.2 (*)    Monocytes Absolute 1.5 (*)    All other components within normal limits  BASIC METABOLIC PANEL - Abnormal; Notable for the following:    Chloride 99 (*)    All other components within normal limits    Imaging Review No results found.   EKG Interpretation None      Pt declined pain and nausea medication.    3:53 PM Discussed pt with Dr Delford Field who assumed care of patient pending CT scan.   MDM   Final diagnoses:  LLQ abdominal pain    Afebrile nontoxic patient with LLQ pain, diarrhea with empiric tx for diverticulitis outpatient two weeks ago with cipro,flagyl, p/w continued diarrhea and new pain.  Sent by UC for CT abd/pelvis to r/o abscess.  May need admission for failed  outpatient treatment if diverticulitis apparent on CT.  Signed out at change of shift pending CT.      Trixie Dredge, PA-C 04/27/15 1555  Elwin Mocha, MD 04/27/15 [phone]

## 2015-04-27 NOTE — ED Notes (Signed)
Some sl pain

## 2015-04-27 NOTE — ED Provider Notes (Signed)
Patients care was taken over from [facility] [place] at approximately 4:31 PM. Please see their note for full HPI.  Pt is a 54 yo M with LLQ pain and diarrhea x 2 weeks.  Initially treated with Cipro/flagyl as outpatient for suspected diverticulitis.  Felt better but diarrhea continued.  Then acute worsening of pain in left side of abd.  No fevers, no urinary sx.  Otherwise healthy.   CT abd/pelvis pending at hand off.  Plan to f/u imaging.    Admit for diverticulitis failing outpatient tx.  Unassigned internal medicine team paged.    Spoke to Dr. Isidoro Donning with triad hospitalist at 1700.  To admit to Med surg floor, inpatient status.  Stable to move when a bed became available.    If performed, labs, EKGs, and imaging were reviewed and interpreted by myself and my attending, and incorporated in the medical decision making.  Patient was seen with ED Attending, Dr. Sandi Mealy, MD     Lenell Antu, MD 04/29/15 [phone]

## 2015-04-27 NOTE — ED Notes (Addendum)
Pt sent here from [facility] for possible diverticulitis.  Pt was tx with oral antibiotics 2 weeks ago for same with improvement.  States 2 days ago began experiencing L side pain and headache.  Labs at [facility] showed elevated wbc.  Pt also c/o increased back pain with inspiration.  States labs were faxed over.

## 2015-04-27 NOTE — Progress Notes (Signed)
Pharmacy note - Zosyn for diverticulitis   Asked to dose Zosyn for diverticulitis.  Scr 1.22 and CrCl ~50mL/min.  Plan: Zosyn 3.375g IV q8h (infuse over 4 hours) Pharmacy will sign off.  Please reconsult if needed.  Celedonio Miyamoto, PharmD, BCPS-AQ ID Clinical Pharmacist Pager [phone]

## 2015-04-27 NOTE — ED Notes (Signed)
Pt finished drinking PO contrast. Phone lines busy in CT at this time.

## 2015-04-27 NOTE — ED Notes (Signed)
Report given to rn on 6n      

## 2015-04-27 NOTE — H&P (Signed)
History and Physical        Hospital Admission Note Date: 04/27/2015  Patient name: Allen Whitney Medical record number: [id number] Date of birth: 1961-07-19 Age: 54 y.o. Gender: male  PCP:  Melinda Crutch, MD  Referring physician: Clayton Bibles  Chief Complaint:  Acute abdominal pain, nausea, diarrhea  HPI: Patient is a 54 year old male with prior history of pulmonary embolism in the left lung, in 2014 after cholecystectomy, prior diverticulitis, OSA presented to ED with abdominal pain and back pain. Patient reported that he had left lower quadrant abdominal pain with diarrhea 2 weeks ago, went to urgent care, was treated for diverticulitis with ciprofloxacin and Flagyl. He finished 10 days course of ciprofloxacin and Flagyl on Tuesday, 6/28. However in last 2 days, patient developed crampy abdominal pain again with diarrhea, one episode today, nausea with low-grade fever. Patient reports that the abdominal pain improves on laying still and is tender on pushing in the area in the left flank area. He also reports fatigue and upper thoracic back pain, which is worse with deep breathing. Patient has a history of pulmonary embolism in the past in 2014 and was on therapeutic antocoagulation for 6 months. Patient denies any recent long distance car travel or air flight. Patient denied any hematemesis, hematochezia or melena. ED workup showed unremarkable BMET however CBC showed WBC count of 14.7 CT of the abdomen and pelvis showed acute diverticulitis in the descending colon  Review of Systems:  Constitutional:  + fever, chills, denies diaphoresis, + poor appetite and fatigue.  HEENT: Denies photophobia, eye pain, redness, hearing loss, ear pain, congestion, sore throat, rhinorrhea, sneezing, mouth sores, trouble swallowing, neck pain, neck stiffness and tinnitus.   Respiratory: Denies SOB, DOE,  cough, chest tightness,  and wheezing.   Cardiovascular: Denies chest pain, palpitations and leg swelling.  Gastrointestinal: Please see history of present illness  Genitourinary: Denies dysuria, urgency, frequency, hematuria, flank pain and difficulty urinating.  Musculoskeletal: Denies myalgias, joint swelling, arthralgias and gait problem. + back pain, upper thoracic area worse on deep breathing and movement Skin: Denies pallor, rash and wound.  Neurological: Denies dizziness, seizures, syncope, weakness, light-headedness, numbness and headaches.  Hematological: Denies adenopathy. Easy bruising, personal or family bleeding history  Psychiatric/Behavioral: Denies suicidal ideation, mood changes, confusion, nervousness, sleep disturbance and agitation  Past Medical History: Past Medical History  Diagnosis Date  . Sleep apnea     uses CPAP at night  . OSA on CPAP 07/07/2013  . Renal insufficiency, mild 07/07/2013  . Gout attack 07/07/2013  . Diverticulitis   . PE (pulmonary embolism)     left lung    Past Surgical History  Procedure Laterality Date  . Tonsillectomy    . Cholecystectomy N/A 07/01/2013    Procedure: LAPAROSCOPIC CHOLECYSTECTOMY;  Surgeon: Odis Hollingshead, MD;  Location: [facility];  Service: General;  Laterality: N/A;  . Umbilical hernia repair N/A 07/01/2013    Procedure: HERNIA REPAIR UMBILICAL ADULT;  Surgeon: Odis Hollingshead, MD;  Location: [facility];  Service: General;  Laterality: N/A;    Medications: Prior to Admission medications   Medication Sig Start Date End Date Taking? Authorizing Provider  meloxicam (MOBIC) 15  MG tablet Take 15 mg by mouth daily.   Yes Historical Provider, MD  Multiple Vitamins-Minerals (MULTIVITAMIN WITH MINERALS) tablet Take 1 tablet by mouth daily.   Yes Historical Provider, MD    Allergies:  No Known Allergies  Social History:  reports that he has never smoked. He does not have any smokeless tobacco history on file. He reports that he  does not drink alcohol or use illicit drugs.  Family History: Patient states that his mother had coronary artery disease but no GI cancers in the family, no diabetes or heart failure.  Physical Exam: Blood pressure 145/74, pulse 84, temperature 100 F (37.8 C), temperature source Oral, resp. rate 18, SpO2 97 %. General: Alert, awake, oriented x3, in no acute distress. HEENT: normocephalic, atraumatic, anicteric sclera, pink conjunctiva, pupils equal and reactive to light and accomodation, oropharynx clear Neck: supple, no masses or lymphadenopathy, no goiter, no bruits  Heart: Regular rate and rhythm, without murmurs, rubs or gallops. Lungs: Clear to auscultation bilaterally, no wheezing, rales or rhonchi. Abdomen: Soft, mildly tender in the left flank area and left lower quadrant area, nondistended, positive bowel sounds, no masses. Extremities: No clubbing, cyanosis or edema with positive pedal pulses. Back: No spinal tenderness Neuro: Grossly intact, no focal neurological deficits, strength 5/5 upper and lower extremities bilaterally Psych: alert and oriented x 3, normal mood and affect Skin: no rashes or lesions, warm and dry   LABS on Admission:  Basic Metabolic Panel:  Recent Labs Lab 04/27/15 1356  NA 136  K 3.8  CL 99*  CO2 28  GLUCOSE 91  BUN 17  CREATININE 1.22  CALCIUM 9.0   Liver Function Tests: No results for input(s): AST, ALT, ALKPHOS, BILITOT, PROT, ALBUMIN in the last 168 hours. No results for input(s): LIPASE, AMYLASE in the last 168 hours. No results for input(s): AMMONIA in the last 168 hours. CBC:  Recent Labs Lab 04/27/15 1356  WBC 14.7*  NEUTROABS 11.2*  HGB 12.9*  HCT 37.3*  MCV 89.4  PLT 225   Cardiac Enzymes: No results for input(s): CKTOTAL, CKMB, CKMBINDEX, TROPONINI in the last 168 hours. BNP: Invalid input(s): POCBNP CBG: No results for input(s): GLUCAP in the last 168 hours.  Radiological Exams on Admission:  Ct Abdomen Pelvis  W Contrast  04/27/2015   CLINICAL DATA:  Left lower quadrant pain. Recent treatment for diverticulitis.  EXAM: CT ABDOMEN AND PELVIS WITH CONTRAST  TECHNIQUE: Multidetector CT imaging of the abdomen and pelvis was performed using the standard protocol following bolus administration of intravenous contrast.  CONTRAST:  122m OMNIPAQUE IOHEXOL 300 MG/ML  SOLN  COMPARISON:  April 01, 2014  FINDINGS: The liver, spleen, pancreas, adrenal glands and kidneys are normal. There is no hydronephrosis bilaterally. Patient is status post prior cholecystectomy. There is atherosclerosis of the abdominal aorta without aneurysmal dilatation. There is no abdominal lymphadenopathy.  There is inflammation surrounding the proximal through distal descending colon particularly around the at diverticula noted throughout the descending colon. There is a segment of descending colon that shows diffuse bowel wall thickening in the mid to lower descending colon. There is no small bowel obstruction. The appendix is normal.  Fluid-filled bladder is normal. Bilateral inguinal herniation of mesenteric fat are noted. There is no pelvic lymphadenopathy. The visualized lung bases are clear. Degenerative joint changes of the spine are noted.  IMPRESSION: Findings in the descending colon which can be due to acute diverticulitis. There is a segment of descending colon that shows diffuse bowel wall  thickening in the mid to lower descending colon. Recommend repeat exam following treatment to ensure complete resolution and to exclude underlying mass.   Electronically Signed   By: Abelardo Diesel M.D.   On: 04/27/2015 16:24    *I have personally reviewed the images above*  EKG: None available   Assessment/Plan Principal Problem:   Abdominal pain, nausea, diarrhea: Due to acute diverticulitis in the descending colon, outpatient failure to treatment - Admit to MedSurg, place on nothing by mouth status, IV fluids, pain control - Obtain ESR, CRP, blood  cultures, stool studies including C. difficile PCR, UA & culture - Place on IV Zosyn - Patient was recommended to obtain GI referral outpatient for colonoscopy after the diverticulitis episode has resolved   Active Problems:   OSA on CPAP - place on CPAP qhs    Acute pleuritic Back pain - Patient has a prior history of pulmonary embolism after cholecystectomy in 2014, complaining of thoracic back pain now, pleuritic in nature. He does not have any spinal tenderness.  - Will obtain stat d-dimer, obtain CTA chest if d-dimer positive    DVT prophylaxis:  Lovenox   CODE STATUS:  full code   Family Communication: Admission, patients condition and plan of care including tests being ordered have been discussed with the patient and wife who indicates understanding and agree with the plan and Code Status  Disposition plan: Further plan will depend as patient's clinical course evolves and further radiologic and laboratory data become available.   Time Spent on Admission: 60 mins   RAI,RIPUDEEP M.D. [facility] 04/27/2015, 5:37 PM Pager: [phone]  If 7PM-7AM, please contact night-coverage [email or web address] Password [email or web address]

## 2015-04-27 NOTE — ED Notes (Signed)
Admitting doctor at the bedside 

## 2015-04-27 NOTE — ED Notes (Signed)
This RN walked over to CT and made them aware that the patient is finished drinking the contrast.

## 2015-04-27 NOTE — ED Notes (Signed)
PA student at the bedside.  

## 2015-04-28 DIAGNOSIS — G4733 Obstructive sleep apnea (adult) (pediatric): Secondary | ICD-10-CM

## 2015-04-28 DIAGNOSIS — K5792 Diverticulitis of intestine, part unspecified, without perforation or abscess without bleeding: Secondary | ICD-10-CM

## 2015-04-28 DIAGNOSIS — M546 Pain in thoracic spine: Secondary | ICD-10-CM

## 2015-04-28 LAB — CBC
HCT: 38.3 % — ABNORMAL LOW (ref 39.0–52.0)
Hemoglobin: 13.1 g/dL (ref 13.0–17.0)
MCH: 30.5 pg (ref 26.0–34.0)
MCHC: 34.2 g/dL (ref 30.0–36.0)
MCV: 89.3 fL (ref 78.0–100.0)
Platelets: 218 10*3/uL (ref 150–400)
RBC: 4.29 MIL/uL (ref 4.22–5.81)
RDW: 13.7 % (ref 11.5–15.5)
WBC: 11.6 10*3/uL — ABNORMAL HIGH (ref 4.0–10.5)

## 2015-04-28 LAB — BASIC METABOLIC PANEL
ANION GAP: 8 (ref 5–15)
BUN: 13 mg/dL (ref 6–20)
CALCIUM: 8.8 mg/dL — AB (ref 8.9–10.3)
CO2: 28 mmol/L (ref 22–32)
Chloride: 103 mmol/L (ref 101–111)
Creatinine, Ser: 1.24 mg/dL (ref 0.61–1.24)
GFR calc non Af Amer: >60 mL/min (ref >60)
Glucose, Bld: 106 mg/dL — ABNORMAL HIGH (ref 65–99)
POTASSIUM: 4.1 mmol/L (ref 3.5–5.1)
SODIUM: 139 mmol/L (ref 135–145)

## 2015-04-28 LAB — CLOSTRIDIUM DIFFICILE BY PCR: Toxigenic C. Difficile by PCR: NEGATIVE

## 2015-04-28 MED ORDER — SODIUM CHLORIDE 0.9 % IV SOLN
INTRAVENOUS | Status: DC
  Administered 2015-04-28: 22:00:00 via INTRAVENOUS

## 2015-04-28 NOTE — Progress Notes (Signed)
Utilization Review Completed.Allen Whitney T7/12/2014  

## 2015-04-28 NOTE — Progress Notes (Signed)
Patient's wife brought his CPAP machine from home last night for him to use. Please do not charge for a hospital CPAP. Thank you.

## 2015-04-28 NOTE — Progress Notes (Signed)
PROGRESS NOTE    Fallon Howerter ZDG:[id number] DOB: [birth year] DOA: 04/27/2015 PCP:  [name], MD  HPI/Brief narrative 54 year old male with history of PE 2014-completed 6 months anticoagulation, cholecystectomy, prior diverticulitis, OSA, presented to [facility] ED on 04/27/15 with mid back pain and abdominal pain. He recently completed 10 days' course of oral Cipro and Flagyl for diverticulitis. He had LLQ pain and diarrhea 2 weeks ago. He completed antibiotic course on 6/28. Couple days later, he again developed cramping left sided abdominal/flank pain, diarrhea, and episode of nausea and low-grade fever. CT abdomen confirmed descending colon acute diverticulitis. CTA chest negative for PE.   Assessment/Plan:  Acute diverticulitis of descending colon - Failed outpatient oral antibiotics-Cipro and Flagyl - Empirically started on IV Zosyn - continue - Treated supportively with bowel rest, IV fluids and pain medications. - Clinically improving. - C. difficile PCR negative. Blood culture pending. Urine culture 2: Negative to date - Start clear liquid diet and advance diet as tolerated - Recommended outpatient follow-up with GI to consider repeat colonoscopy after acute phase has resolved. - States that he has seen GI in the past (cannot recollect the name) and had colonoscopy 3 years ago which apart from diverticulosis was negative  Mid back pain - Seems musculoskeletal in etiology - CTA chest negative for PE - Mild. Pain management  OSA - Continue nightly CPAP  History of PE - Completed 6 months anticoagulation in 2014 - CTA chest: Negative for PE  6 mm RLL pulmonary nodule - Seen on CTA chest. Reported stable since 06/27/13. As per recommendations, one additional follow-up scan to ensure stability.    DVT prophylaxis: Lovenox Code Status: Full Family Communication: None at bedside Disposition Plan: DC home when medically stable, possibly in 2-3  days.   Consultants:  None  Procedures:  None  Antibiotics:  IV Zosyn 7/2 >   Subjective: Feels better. Decreased abdominal pain. No nausea or vomiting. Decreasing diarrhea. Minimal mid scapular back pain-worse with deep breaths. No dyspnea.  Objective: Filed Vitals:   04/27/15 1800 04/27/15 1835 04/27/15 2100 04/28/15 0500  BP:  144/78 153/78 133/77  Pulse:  78 80 77  Temp:  99.3 F (37.4 C) 99.4 F (37.4 C) 98.4 F (36.9 C)  TempSrc:  Oral Oral Oral  Resp:  16 18 18   Height: 5\' 11"  (1.803 m)     Weight: 99.791 kg (220 lb)     SpO2:  95% 94% 96%    Intake/Output Summary (Last 24 hours) at 04/28/15 1321 Last data filed at 04/28/15 1304  Gross per 24 hour  Intake 1785.42 ml  Output    400 ml  Net 1385.42 ml   Filed Weights   04/27/15 1800  Weight: 99.791 kg (220 lb)     Exam:  General exam: Pleasant middle-aged male lying comfortably supine in bed. Respiratory system: Clear. No increased work of breathing. Cardiovascular system: S1 & S2 heard, RRR. No JVD, murmurs, gallops, clicks or pedal edema. Gastrointestinal system: Abdomen is nondistended, soft and nontender. Normal bowel sounds heard. Central nervous system: Alert and oriented. No focal neurological deficits. Extremities: Symmetric 5 x 5 power.   Data Reviewed: Basic Metabolic Panel:  Recent Labs Lab 04/27/15 1356 04/28/15 0355  NA 136 139  K 3.8 4.1  CL 99* 103  CO2 28 28  GLUCOSE 91 106*  BUN 17 13  CREATININE 1.22 1.24  CALCIUM 9.0 8.8*   Liver Function Tests: No results for input(s): AST, ALT, ALKPHOS, BILITOT, PROT, ALBUMIN  in the last 168 hours.  Recent Labs Lab 04/27/15 1918  LIPASE 20*   No results for input(s): AMMONIA in the last 168 hours. CBC:  Recent Labs Lab 04/27/15 1356 04/28/15 0355  WBC 14.7* 11.6*  NEUTROABS 11.2*  --   HGB 12.9* 13.1  HCT 37.3* 38.3*  MCV 89.4 89.3  PLT 225 218   Cardiac Enzymes: No results for input(s): CKTOTAL, CKMB, CKMBINDEX,  TROPONINI in the last 168 hours. BNP (last 3 results) No results for input(s): PROBNP in the last 8760 hours. CBG: No results for input(s): GLUCAP in the last 168 hours.  Recent Results (from the past 240 hour(s))  Urine culture     Status: None (Preliminary result)   Collection Time: 04/27/15  5:40 PM  Result Value Ref Range Status   Specimen Description URINE, CLEAN CATCH  Final   Special Requests NONE  Final   Culture NO GROWTH < 24 HOURS  Final   Report Status PENDING  Incomplete  Culture, blood (routine x 2)     Status: None (Preliminary result)   Collection Time: 04/27/15  7:13 PM  Result Value Ref Range Status   Specimen Description BLOOD LEFT FOREARM  Final   Special Requests IN PEDIATRIC BOTTLE 4CC  Final   Culture PENDING  Incomplete   Report Status PENDING  Incomplete  Urine culture     Status: None (Preliminary result)   Collection Time: 04/27/15  7:33 PM  Result Value Ref Range Status   Specimen Description URINE, RANDOM  Final   Special Requests NONE  Final   Culture NO GROWTH < 24 HOURS  Final   Report Status PENDING  Incomplete  Clostridium Difficile by PCR (not at [facility])     Status: None   Collection Time: 04/27/15  9:03 PM  Result Value Ref Range Status   C difficile by pcr NEGATIVE NEGATIVE Final  Stool culture     Status: None (Preliminary result)   Collection Time: 04/27/15  9:03 PM  Result Value Ref Range Status   Specimen Description STOOL  Final   Special Requests NONE  Final   Culture   Final    Culture reincubated for better growth Performed at [facility]    Report Status PENDING  Incomplete         Studies: Ct Angio Chest Pe W/cm &/or Wo Cm  04/27/2015   CLINICAL DATA:  Elevated D-dimer.  No chest pain.  No dyspnea.  EXAM: CT ANGIOGRAPHY CHEST WITH CONTRAST  TECHNIQUE: Multidetector CT imaging of the chest was performed using the standard protocol during bolus administration of intravenous contrast. Multiplanar CT image  reconstructions and MIPs were obtained to evaluate the vascular anatomy.  CONTRAST:  77mL OMNIPAQUE IOHEXOL 350 MG/ML SOLN  COMPARISON:  02/20/2014  FINDINGS: Cardiovascular: There is good opacification of the pulmonary arteries. There is no pulmonary embolism. The thoracic aorta is normal in caliber and intact.  Lungs: There is an unchanged noncalcified 6 mm right lower lobe nodule posterolaterally. The lungs are otherwise clear except for minimal atelectatic -appearing linear posterior base opacities.  Central airways: Patent  Effusions: None  Lymphadenopathy: None  Esophagus: Unremarkable  Upper abdomen: No significant abnormality  Musculoskeletal: No significant abnormality.  Review of the MIP images confirms the above findings.  IMPRESSION: 1. Negative for pulmonary embolism. 2. Unchanged 6 mm right lower lobe pulmonary nodule, now with documented stability since 06/27/2013. One additional follow-up scan would be optimal to document stability for longer than 24  months.   Electronically Signed   By: Ellery Plunk M.D.   On: 04/27/2015 22:16   Ct Abdomen Pelvis W Contrast  04/27/2015   CLINICAL DATA:  Left lower quadrant pain. Recent treatment for diverticulitis.  EXAM: CT ABDOMEN AND PELVIS WITH CONTRAST  TECHNIQUE: Multidetector CT imaging of the abdomen and pelvis was performed using the standard protocol following bolus administration of intravenous contrast.  CONTRAST:  OMNIPAQUE IOHEXOL 300 MG/ML  SOLN  COMPARISON:  April 01, 2014  FINDINGS: The liver, spleen, pancreas, adrenal glands and kidneys are normal. There is no hydronephrosis bilaterally. Patient is status post prior cholecystectomy. There is atherosclerosis of the abdominal aorta without aneurysmal dilatation. There is no abdominal lymphadenopathy.  There is inflammation surrounding the proximal through distal descending colon particularly around the at diverticula noted throughout the descending colon. There is a segment of descending  colon that shows diffuse bowel wall thickening in the mid to lower descending colon. There is no small bowel obstruction. The appendix is normal.  Fluid-filled bladder is normal. Bilateral inguinal herniation of mesenteric fat are noted. There is no pelvic lymphadenopathy. The visualized lung bases are clear. Degenerative joint changes of the spine are noted.  IMPRESSION: Findings in the descending colon which can be due to acute diverticulitis. There is a segment of descending colon that shows diffuse bowel wall thickening in the mid to lower descending colon. Recommend repeat exam following treatment to ensure complete resolution and to exclude underlying mass.   Electronically Signed   By: Sherian Rein M.D.   On: 04/27/2015 16:24        Scheduled Meds: . enoxaparin (LOVENOX) injection  40 mg Subcutaneous Q24H  . piperacillin-tazobactam (ZOSYN)  IV  3.375 g Intravenous 3 times per day  . piperacillin-tazobactam  3.375 g Intravenous Once   Continuous Infusions: . sodium chloride 125 mL/hr at 04/28/15 1610    Principal Problem:   Abdominal pain Active Problems:   OSA on CPAP   Diverticulitis   Back pain   Acute diverticulitis    Time spent: 25 minutes    Addisyn Leclaire, MD, FACP, FHM. [facility] Pager [phone]  If 7PM-7AM, please contact night-coverage [email or web address] Password [email or web address] 04/28/2015, 1:21 PM    LOS: 1 day

## 2015-04-29 LAB — URINE CULTURE
CULTURE: NO GROWTH
Culture: NO GROWTH

## 2015-04-29 NOTE — Progress Notes (Signed)
PROGRESS NOTE    Reco Iseminger DJM:[id number] DOB: 01/30/1961 DOA: 04/27/2015 PCP:  [name], MD  HPI/Brief narrative 54 year old male with history of PE 2014-completed 6 months anticoagulation, cholecystectomy, prior diverticulitis, OSA, presented to [facility] ED on 04/27/15 with mid back pain and abdominal pain. He recently completed 10 days' course of oral Cipro and Flagyl for diverticulitis. He had LLQ pain and diarrhea 2 weeks ago. He completed antibiotic course on 6/28. Couple days later, he again developed cramping left sided abdominal/flank pain, diarrhea, and episode of nausea and low-grade fever. CT abdomen confirmed descending colon acute diverticulitis. CTA chest negative for PE.   Assessment/Plan:  Acute diverticulitis of descending colon - Failed outpatient oral antibiotics-Cipro and Flagyl - Empirically started on IV Zosyn - continue - Treated supportively with bowel rest, IV fluids and pain medications. - Clinically improving >decreased pain, decreased stool quantity and improving consistency. - C. difficile PCR negative. Blood culture pending. Urine culture 2: Negative to date - Start clear liquid diet and advance diet as tolerated - Recommended outpatient follow-up with GI to consider repeat colonoscopy after acute phase has resolved. - Colonoscopy May 2012 by Dr. [name]. Results not known - Continue additional day of IV antibiotics and DC home in a.m. on oral Augmentin 2 weeks with outpatient GI follow-up.  Mid back pain - Seems musculoskeletal in etiology - CTA chest negative for PE - Mild. Pain management  OSA - Continue nightly CPAP  History of PE - Completed 6 months anticoagulation in 2014 - CTA chest: Negative for PE  6 mm RLL pulmonary nodule - Seen on CTA chest. Reported stable since 06/27/13. As per recommendations, one additional follow-up scan to ensure stability.    DVT prophylaxis: Lovenox Code Status: Full Family Communication: None at  bedside Disposition Plan: DC home when medically stable, possibly 7/5   Consultants:  None  Procedures:  None  Antibiotics:  IV Zosyn 7/2 >   Subjective: Overall better. Reduced abdominal pain. Decreased stool quantity and improving consistency.  Objective: Filed Vitals:   04/28/15 0500 04/28/15 1406 04/28/15 2139 04/29/15 0515  BP: 133/77 148/76 152/79 152/81  Pulse: 77 76 69 97  Temp: 98.4 F (36.9 C) 98.2 F (36.8 C) 99.1 F (37.3 C) 98 F (36.7 C)  TempSrc: Oral Oral Oral Oral  Resp: 18 18 18 18   Height:      Weight:      SpO2: 96% 98% 98% 97%    Intake/Output Summary (Last 24 hours) at 04/29/15 1552 Last data filed at 04/29/15 0522  Gross per 24 hour  Intake   1175 ml  Output      0 ml  Net   1175 ml   Filed Weights   04/27/15 1800  Weight: 99.791 kg (220 lb)     Exam:  General exam: Pleasant middle-aged male sitting up comfortably on chair this morning. Respiratory system: Clear. No increased work of breathing. Cardiovascular system: S1 & S2 heard, RRR. No JVD, murmurs, gallops, clicks or pedal edema. Gastrointestinal system: Abdomen is nondistended, soft and nontender. Normal bowel sounds heard. Central nervous system: Alert and oriented. No focal neurological deficits. Extremities: Symmetric 5 x 5 power.   Data Reviewed: Basic Metabolic Panel:  Recent Labs Lab 04/27/15 1356 04/28/15 0355  NA 136 139  K 3.8 4.1  CL 99* 103  CO2 28 28  GLUCOSE 91 106*  BUN 17 13  CREATININE 1.22 1.24  CALCIUM 9.0 8.8*   Liver Function Tests: No results for input(s):  AST, ALT, ALKPHOS, BILITOT, PROT, ALBUMIN in the last 168 hours.  Recent Labs Lab 04/27/15 1918  LIPASE 20*   No results for input(s): AMMONIA in the last 168 hours. CBC:  Recent Labs Lab 04/27/15 1356 04/28/15 0355  WBC 14.7* 11.6*  NEUTROABS 11.2*  --   HGB 12.9* 13.1  HCT 37.3* 38.3*  MCV 89.4 89.3  PLT 225 218   Cardiac Enzymes: No results for input(s): CKTOTAL,  CKMB, CKMBINDEX, TROPONINI in the last 168 hours. BNP (last 3 results) No results for input(s): PROBNP in the last 8760 hours. CBG: No results for input(s): GLUCAP in the last 168 hours.  Recent Results (from the past 240 hour(s))  Urine culture     Status: None   Collection Time: 04/27/15  5:40 PM  Result Value Ref Range Status   Specimen Description URINE, CLEAN CATCH  Final   Special Requests NONE  Final   Culture NO GROWTH 2 DAYS  Final   Report Status 04/29/2015 FINAL  Final  Culture, blood (routine x 2)     Status: None (Preliminary result)   Collection Time: 04/27/15  7:07 PM  Result Value Ref Range Status   Specimen Description BLOOD LEFT ANTECUBITAL  Final   Special Requests BOTTLES DRAWN AEROBIC AND ANAEROBIC 10CC EACH  Final   Culture NO GROWTH 2 DAYS  Final   Report Status PENDING  Incomplete  Culture, blood (routine x 2)     Status: None (Preliminary result)   Collection Time: 04/27/15  7:13 PM  Result Value Ref Range Status   Specimen Description BLOOD LEFT FOREARM  Final   Special Requests IN PEDIATRIC BOTTLE 4CC  Final   Culture NO GROWTH 2 DAYS  Final   Report Status PENDING  Incomplete  Urine culture     Status: None   Collection Time: 04/27/15  7:33 PM  Result Value Ref Range Status   Specimen Description URINE, RANDOM  Final   Special Requests NONE  Final   Culture NO GROWTH 2 DAYS  Final   Report Status 04/29/2015 FINAL  Final  Clostridium Difficile by PCR (not at [facility])     Status: None   Collection Time: 04/27/15  9:03 PM  Result Value Ref Range Status   C difficile by pcr NEGATIVE NEGATIVE Final  Stool culture     Status: None (Preliminary result)   Collection Time: 04/27/15  9:03 PM  Result Value Ref Range Status   Specimen Description STOOL  Final   Special Requests NONE  Final   Culture   Final    NO SUSPICIOUS COLONIES, CONTINUING TO HOLD Performed at [facility]    Report Status PENDING  Incomplete         Studies: Ct Angio  Chest Pe W/cm &/or Wo Cm  04/27/2015   CLINICAL DATA:  Elevated D-dimer.  No chest pain.  No dyspnea.  EXAM: CT ANGIOGRAPHY CHEST WITH CONTRAST  TECHNIQUE: Multidetector CT imaging of the chest was performed using the standard protocol during bolus administration of intravenous contrast. Multiplanar CT image reconstructions and MIPs were obtained to evaluate the vascular anatomy.  CONTRAST:  80mL OMNIPAQUE IOHEXOL 350 MG/ML SOLN  COMPARISON:  02/20/2014  FINDINGS: Cardiovascular: There is good opacification of the pulmonary arteries. There is no pulmonary embolism. The thoracic aorta is normal in caliber and intact.  Lungs: There is an unchanged noncalcified 6 mm right lower lobe nodule posterolaterally. The lungs are otherwise clear except for minimal atelectatic -appearing linear posterior  base opacities.  Central airways: Patent  Effusions: None  Lymphadenopathy: None  Esophagus: Unremarkable  Upper abdomen: No significant abnormality  Musculoskeletal: No significant abnormality.  Review of the MIP images confirms the above findings.  IMPRESSION: 1. Negative for pulmonary embolism. 2. Unchanged 6 mm right lower lobe pulmonary nodule, now with documented stability since 06/27/2013. One additional follow-up scan would be optimal to document stability for longer than 24 months.   Electronically Signed   By: Ellery Plunk M.D.   On: 04/27/2015 22:16   Ct Abdomen Pelvis W Contrast  04/27/2015   CLINICAL DATA:  Left lower quadrant pain. Recent treatment for diverticulitis.  EXAM: CT ABDOMEN AND PELVIS WITH CONTRAST  TECHNIQUE: Multidetector CT imaging of the abdomen and pelvis was performed using the standard protocol following bolus administration of intravenous contrast.  CONTRAST:  OMNIPAQUE IOHEXOL 300 MG/ML  SOLN  COMPARISON:  April 01, 2014  FINDINGS: The liver, spleen, pancreas, adrenal glands and kidneys are normal. There is no hydronephrosis bilaterally. Patient is status post prior cholecystectomy.  There is atherosclerosis of the abdominal aorta without aneurysmal dilatation. There is no abdominal lymphadenopathy.  There is inflammation surrounding the proximal through distal descending colon particularly around the at diverticula noted throughout the descending colon. There is a segment of descending colon that shows diffuse bowel wall thickening in the mid to lower descending colon. There is no small bowel obstruction. The appendix is normal.  Fluid-filled bladder is normal. Bilateral inguinal herniation of mesenteric fat are noted. There is no pelvic lymphadenopathy. The visualized lung bases are clear. Degenerative joint changes of the spine are noted.  IMPRESSION: Findings in the descending colon which can be due to acute diverticulitis. There is a segment of descending colon that shows diffuse bowel wall thickening in the mid to lower descending colon. Recommend repeat exam following treatment to ensure complete resolution and to exclude underlying mass.   Electronically Signed   By: Sherian Rein M.D.   On: 04/27/2015 16:24        Scheduled Meds: . enoxaparin (LOVENOX) injection  40 mg Subcutaneous Q24H  . piperacillin-tazobactam (ZOSYN)  IV  3.375 g Intravenous 3 times per day   Continuous Infusions:    Principal Problem:   Abdominal pain Active Problems:   OSA on CPAP   Diverticulitis   Back pain   Acute diverticulitis    Time spent: 25 minutes    Wenonah Milo, MD, FACP, FHM. [facility] Pager [phone]  If 7PM-7AM, please contact night-coverage [email or web address] Password [email or web address] 04/29/2015, 3:52 PM    LOS: 2 days

## 2015-04-29 NOTE — Progress Notes (Signed)
1415 water yellowish small amount of stool

## 2015-04-29 NOTE — Progress Notes (Signed)
Patient has had 2 stools today both yellowish liquid color and medium amount

## 2015-04-30 LAB — OVA AND PARASITE EXAMINATION: Ova and parasites: NONE SEEN

## 2015-04-30 LAB — FECAL LACTOFERRIN, QUANT: Fecal Lactoferrin: POSITIVE

## 2015-04-30 LAB — BASIC METABOLIC PANEL
Anion gap: 8 (ref 5–15)
BUN: 10 mg/dL (ref 6–20)
CALCIUM: 9.2 mg/dL (ref 8.9–10.3)
CHLORIDE: 105 mmol/L (ref 101–111)
CO2: 29 mmol/L (ref 22–32)
CREATININE: 1.29 mg/dL — AB (ref 0.61–1.24)
Glucose, Bld: 95 mg/dL (ref 65–99)
POTASSIUM: 4.4 mmol/L (ref 3.5–5.1)
Sodium: 142 mmol/L (ref 135–145)

## 2015-04-30 MED ORDER — AMOXICILLIN-POT CLAVULANATE 875-125 MG PO TABS: 1.0000 | ORAL_TABLET | Freq: Two times a day (BID) | ORAL | Status: DC

## 2015-04-30 NOTE — Discharge Instructions (Addendum)
Diverticulitis °Diverticulitis is inflammation or infection of small pouches in your colon that form when you have a condition called diverticulosis. The pouches in your colon are called diverticula. Your colon, or large intestine, is where water is absorbed and stool is formed. °Complications of diverticulitis can include: °· Bleeding. °· Severe infection. °· Severe pain. °· Perforation of your colon. °· Obstruction of your colon. °CAUSES  °Diverticulitis is caused by bacteria. °Diverticulitis happens when stool becomes trapped in diverticula. This allows bacteria to grow in the diverticula, which can lead to inflammation and infection. °RISK FACTORS °People with diverticulosis are at risk for diverticulitis. Eating a diet that does not include enough fiber from fruits and vegetables may make diverticulitis more likely to develop. °SYMPTOMS  °Symptoms of diverticulitis may include: °· Abdominal pain and tenderness. The pain is normally located on the left side of the abdomen, but may occur in other areas. °· Fever and chills. °· Bloating. °· Cramping. °· Nausea. °· Vomiting. °· Constipation. °· Diarrhea. °· Blood in your stool. °DIAGNOSIS  °Your health care provider will ask you about your medical history and do a physical exam. You may need to have tests done because many medical conditions can cause the same symptoms as diverticulitis. Tests may include: °· Blood tests. °· Urine tests. °· Imaging tests of the abdomen, including X-rays and CT scans. °When your condition is under control, your health care provider may recommend that you have a colonoscopy. A colonoscopy can show how severe your diverticula are and whether something else is causing your symptoms. °TREATMENT  °Most cases of diverticulitis are mild and can be treated at home. Treatment may include: °· Taking over-the-counter pain medicines. °· Following a clear liquid diet. °· Taking antibiotic medicines by mouth for 7-10 days. °More severe cases may  be treated at a hospital. Treatment may include: °· Not eating or drinking. °· Taking prescription pain medicine. °· Receiving antibiotic medicines through an IV tube. °· Receiving fluids and nutrition through an IV tube. °· Surgery. °HOME CARE INSTRUCTIONS  °· Follow your health care provider's instructions carefully. °· Follow a full liquid diet or other diet as directed by your health care provider. After your symptoms improve, your health care provider may tell you to change your diet. He or she may recommend you eat a high-fiber diet. Fruits and vegetables are good sources of fiber. Fiber makes it easier to pass stool. °· Take fiber supplements or probiotics as directed by your health care provider. °· Only take medicines as directed by your health care provider. °· Keep all your follow-up appointments. °SEEK MEDICAL CARE IF:  °· Your pain does not improve. °· You have a hard time eating food. °· Your bowel movements do not return to normal. °SEEK IMMEDIATE MEDICAL CARE IF:  °· Your pain becomes worse. °· Your symptoms do not get better. °· Your symptoms suddenly get worse. °· You have a fever. °· You have repeated vomiting. °· You have bloody or black, tarry stools. °MAKE SURE YOU:  °· Understand these instructions. °· Will watch your condition. °· Will get help right away if you are not doing well or get worse. °Document Released: 07/22/2005 Document Revised: 10/17/2013 Document Reviewed: 09/06/2013 °Exit[employer]® Patient Information ©2015 [employer]. This information is not intended to replace advice given to you by your health care provider. Make sure you discuss any questions you have with your health care provider. ° °Low-Fiber Diet °Fiber is found in fruits, vegetables, and whole grains. A low-fiber   diet restricts fibrous foods that are not digested in the small intestine. A diet containing about 10-15 grams of fiber per day is considered low fiber. Low-fiber diets may be used to: °· Promote healing and  rest the bowel during intestinal flare-ups. °· Prevent blockage of a partially obstructed or narrowed gastrointestinal tract. °· Reduce fecal weight and volume. °· Slow the movement of feces. °You may be on a low-fiber diet as a transitional diet following surgery, after an injury (trauma), or because of a short (acute) or lifelong (chronic) illness. Your health care provider will determine the length of time you need to stay on this diet.  °WHAT DO I NEED TO KNOW ABOUT A LOW-FIBER DIET? °Always check the fiber content on the packaging's Nutrition Facts label, especially on foods from the grains list. Ask your dietitian if you have questions about specific foods that are related to your condition, especially if the food is not listed below. In general, a low-fiber food will have less than 2 g of fiber. °WHAT FOODS CAN I EAT? °Grains °All breads and crackers made with white flour. Sweet rolls, doughnuts, waffles, pancakes, French toast, bagels. Pretzels, Melba toast, zwieback. Well-cooked cereals, such as cornmeal, farina, or cream cereals. Dry cereals that do not contain whole grains, fruit, or nuts, such as refined corn, wheat, rice, and oat cereals. Potatoes prepared any way without skins, plain pastas and noodles, refined white rice. Use white flour for baking and making sauces. Use allowed list of grains for casseroles, dumplings, and puddings.  °Vegetables °Strained tomato and vegetable juices. Fresh lettuce, cucumber, spinach. Well-cooked (no skin or pulp) or canned vegetables, such as asparagus, bean sprouts, beets, carrots, green beans, mushrooms, potatoes, pumpkin, spinach, yellow squash, tomato sauce/puree, turnips, yams, and zucchini. Keep servings limited to ½ cup.  °Fruits °All fruit juices except prune juice. Cooked or canned fruits without skin and seeds, such as applesauce, apricots, cherries, fruit cocktail, grapefruit, grapes, mandarin oranges, melons, peaches, pears, pineapple, and plums. Fresh  fruits without skin, such as apricots, avocados, bananas, melons, pineapple, nectarines, and peaches. Keep servings limited to ½ cup or 1 piece.   °Meat and Other Protein Sources °Ground or well-cooked tender beef, ham, veal, lamb, pork, or poultry. Eggs, plain cheese. Fish, oysters, shrimp, lobster, and other seafood. Liver, organ meats. Smooth nut butters. °Dairy °All milk products and alternative dairy substitutes, such as soy, rice, almond, and coconut, not containing added whole nuts, seeds, or added fruit. °Beverages °Decaf coffee, fruit, and vegetable juices or smoothies (small amounts, with no pulp or skins, and with fruits from allowed list), sports drinks, herbal tea. °Condiments °Ketchup, mustard, vinegar, cream sauce, cheese sauce, cocoa powder. Spices in moderation, such as allspice, basil, bay leaves, celery powder or leaves, cinnamon, cumin powder, curry powder, ginger, mace, marjoram, onion or garlic powder, oregano, paprika, parsley flakes, ground pepper, rosemary, sage, savory, tarragon, thyme, and turmeric. °Sweets and Desserts °Plain cakes and cookies, pie made with allowed fruit, pudding, custard, cream pie. Gelatin, fruit, ice, sherbet, frozen ice pops. Ice cream, ice milk without nuts. Plain hard candy, honey, jelly, molasses, syrup, sugar, chocolate syrup, gumdrops, marshmallows. Limit overall sugar intake.  °Fats and Oil °Margarine, butter, cream, mayonnaise, salad oils, plain salad dressings made from allowed foods. Choose healthy fats such as olive oil, canola oil, and omega-3 fatty acids (such as found in salmon or tuna) when possible.  °Other °Bouillon, broth, or cream soups made from allowed foods. Any strained soup. Casseroles or mixed dishes made with   allowed foods. °The items listed above may not be a complete list of recommended foods or beverages. Contact your dietitian for more options.  °WHAT FOODS ARE NOT RECOMMENDED? °Grains °All whole wheat and whole grain breads and crackers.  Multigrains, rye, bran seeds, nuts, or coconut. Cereals containing whole grains, multigrains, bran, coconut, nuts, raisins. Cooked or dry oatmeal, steel-cut oats. Coarse wheat cereals, granola. Cereals advertised as high fiber. Potato skins. Whole grain pasta, wild or brown rice. Popcorn. Coconut flour. Bran, buckwheat, corn bread, multigrains, rye, wheat germ.  °Vegetables °Fresh, cooked or canned vegetables, such as artichokes, asparagus, beet greens, broccoli, Brussels sprouts, cabbage, celery, cauliflower, corn, eggplant, kale, legumes or beans, okra, peas, and tomatoes. Avoid large servings of any vegetables, especially raw vegetables.  °Fruits °Fresh fruits, such as apples with or without skin, berries, cherries, figs, grapes, grapefruit, guavas, kiwis, mangoes, oranges, papayas, pears, persimmons, pineapple, and pomegranate. Prune juice and juices with pulp, stewed or dried prunes. Dried fruits, dates, raisins. Fruit seeds or skins. Avoid large servings of all fresh fruits. °Meats and Other Protein Sources °Tough, fibrous meats with gristle. Chunky nut butter. Cheese made with seeds, nuts, or other foods not recommended. Nuts, seeds, legumes (beans, including baked beans), dried peas, beans, lentils.  °Dairy °Yogurt or cheese that contains nuts, seeds, or added fruit.  °Beverages °Fruit juices with high pulp, prune juice. Caffeinated coffee and teas.  °Condiments °Coconut, maple syrup, pickles, olives. °Sweets and Desserts °Desserts, cookies, or candies that contain nuts or coconut, chunky peanut butter, dried fruits. Jams, preserves with seeds, marmalade. Large amounts of sugar and sweets. Any other dessert made with fruits from the not recommended list.  °Other °Soups made from vegetables that are not recommended or that contain other foods not recommended.  °The items listed above may not be a complete list of foods and beverages to avoid. Contact your dietitian for more information. °Document Released:  04/03/2002 Document Revised: 10/17/2013 Document Reviewed: 09/04/2013 °ExitCare® Patient Information ©2015 [employer]. This information is not intended to replace advice given to you by your health care provider. Make sure you discuss any questions you have with your health care provider. ° °

## 2015-04-30 NOTE — Discharge Summary (Signed)
Physician Discharge Summary  Allen Whitney:[id number] DOB: Jul 31, 1961 DOA: 04/27/2015  PCP:  [name], MD  Admit date: 04/27/2015 Discharge date: 04/30/2015  Time spent: Less than 30 minutes  Recommendations for Outpatient Follow-up:  1. Dr. [name], PCP in 5 days with repeat labs (CBC & BMP). Please follow final stool culture, stool O & P, stool GI pathogen panel & blood culture results which were sent from the hospital. 2. Dr. [name], [facility] in 1 week. 3. Outpatient follow-up of 6 mm RLL pulmonary nodule, as deemed necessary.  Discharge Diagnoses:  Principal Problem:   Abdominal pain Active Problems:   OSA on CPAP   Diverticulitis   Back pain   Acute diverticulitis   Discharge Condition: Improved & Stable  Diet recommendation: Low residue diet  Filed Weights   04/27/15 1800  Weight: 99.791 kg (220 lb)    History of present illness:  54 year old male with history of PE 2014-completed 6 months anticoagulation, cholecystectomy, prior diverticulitis, OSA, presented to [facility] ED on 04/27/15 with mid back pain and abdominal pain. He recently completed 10 days' course of oral Cipro and Flagyl for diverticulitis. He had LLQ pain and diarrhea 2 weeks ago. He completed antibiotic course on 6/28. Couple days later, he again developed cramping left sided abdominal/flank pain, diarrhea, and episode of nausea and low-grade fever. CT abdomen confirmed descending colon acute diverticulitis. CTA chest negative for PE.  Hospital Course:   Acute diverticulitis of descending colon - Failed outpatient oral antibiotics-Cipro and Flagyl - Empirically started on IV Zosyn-completed approximately 3 days course - Treated supportively with bowel rest, IV fluids and pain medications. - Clinically improved>decreased pain, decreased stool quantity, frequency and improving consistency. States that he overall feels 90-95 percent better. - C. difficile PCR negative. Blood culture 2: Negative  to date. Urine culture 2: Negative. - Stool culture: Preliminary result shows no suspicious colonies-final results pending. GI pathogen and will PCR and stool O&P results pending. - Diet was gradually advanced which she has tolerated. - Discussed with [facility] M.D. on call on 04/29/15 who recommended transitioning to oral Augmentin to complete 14 days treatment and outpatient follow-up with Dr. [name] in 1 week. - Colonoscopy May 2012 by Dr. [name]. Results not known  Mid back pain - Seems musculoskeletal in etiology - CTA chest negative for PE - Seems to have resolved.  OSA - Continue nightly CPAP  History of PE - Completed 6 months anticoagulation in 2014 - CTA chest: Negative for PE  6 mm RLL pulmonary nodule - Seen on CTA chest. Reported stable since 06/27/13. As per recommendations, one additional follow-up scan to ensure stability.  ? Stage II chronic kidney disease - Creatinine stable in the 1.2 range.   Consultants:  None  Procedures:  None  Discharge Exam:  Complaints: States that he feels 90-95 percent better. No abdominal pain. Tolerating low-residue diet. No stools documented in Epic. Patient however indicates that his stool frequency (may have had 2-3 BMs in the last 24 hours), quantity and consistency have all progressively improved. He is anxious to go home.  Filed Vitals:   04/28/15 2139 04/29/15 0515 04/29/15 2023 04/30/15 0445  BP: 152/79 152/81 135/85 152/78  Pulse: 69 97 99 66  Temp: 99.1 F (37.3 C) 98 F (36.7 C) 98.4 F (36.9 C) 97.8 F (36.6 C)  TempSrc: Oral Oral Oral Oral  Resp: 18 18 17 17   Height:      Weight:      SpO2: 98%  97% 99% 98%    General exam: Pleasant middle-aged male sitting up comfortably on chair this morning. Respiratory system: Clear. No increased work of breathing. Cardiovascular system: S1 & S2 heard, RRR. No JVD, murmurs, gallops, clicks or pedal edema. Gastrointestinal system: Abdomen is nondistended, soft and  nontender. Normal bowel sounds heard. Central nervous system: Alert and oriented. No focal neurological deficits. Extremities: Symmetric 5 x 5 power.  Discharge Instructions      Discharge Instructions    Activity as tolerated - No restrictions    Complete by:  As directed      Call MD for:  difficulty breathing, headache or visual disturbances    Complete by:  As directed      Call MD for:  extreme fatigue    Complete by:  As directed      Call MD for:  hives    Complete by:  As directed      Call MD for:  persistant dizziness or light-headedness    Complete by:  As directed      Call MD for:  persistant nausea and vomiting    Complete by:  As directed      Call MD for:  severe uncontrolled pain    Complete by:  As directed      Call MD for:  temperature >100.4    Complete by:  As directed      Discharge instructions    Complete by:  As directed   Diet: Low fiber diet.            Medication List    TAKE these medications        amoxicillin-clavulanate 875-125 MG per tablet  Commonly known as:  AUGMENTIN  Take 1 tablet by mouth 2 (two) times daily.     meloxicam 15 MG tablet  Commonly known as:  MOBIC  Take 15 mg by mouth daily.     multivitamin with minerals tablet  Take 1 tablet by mouth daily.       Follow-up Information    Follow up with SCHOOLER,VINCENT C., MD. Schedule an appointment as soon as possible for a visit in 1 week.   Specialty:  Gastroenterology   Why:  Follow up with GI   Contact information:   [address] [place] [address] [phone]       Follow up with  [name], MD In 5 days.   Specialty:  Family Medicine   Why:  Repeat Labs CBC and BMP   Contact information:   [address] [place] [address] [phone]        The results of significant diagnostics from this hospitalization (including imaging, microbiology, ancillary and laboratory) are listed below for reference.    Significant  Diagnostic Studies: Ct Angio Chest Pe W/cm &/or Wo Cm  04/27/2015   CLINICAL DATA:  Elevated D-dimer.  No chest pain.  No dyspnea.  EXAM: CT ANGIOGRAPHY CHEST WITH CONTRAST  TECHNIQUE: Multidetector CT imaging of the chest was performed using the standard protocol during bolus administration of intravenous contrast. Multiplanar CT image reconstructions and MIPs were obtained to evaluate the vascular anatomy.  CONTRAST:  41mL OMNIPAQUE IOHEXOL 350 MG/ML SOLN  COMPARISON:  02/20/2014  FINDINGS: Cardiovascular: There is good opacification of the pulmonary arteries. There is no pulmonary embolism. The thoracic aorta is normal in caliber and intact.  Lungs: There is an unchanged noncalcified 6 mm right lower lobe nodule posterolaterally. The lungs are otherwise clear  except for minimal atelectatic -appearing linear posterior base opacities.  Central airways: Patent  Effusions: None  Lymphadenopathy: None  Esophagus: Unremarkable  Upper abdomen: No significant abnormality  Musculoskeletal: No significant abnormality.  Review of the MIP images confirms the above findings.  IMPRESSION: 1. Negative for pulmonary embolism. 2. Unchanged 6 mm right lower lobe pulmonary nodule, now with documented stability since 06/27/2013. One additional follow-up scan would be optimal to document stability for longer than 24 months.   Electronically Signed   By: Ellery Plunk M.D.   On: 04/27/2015 22:16   Ct Abdomen Pelvis W Contrast  04/27/2015   CLINICAL DATA:  Left lower quadrant pain. Recent treatment for diverticulitis.  EXAM: CT ABDOMEN AND PELVIS WITH CONTRAST  TECHNIQUE: Multidetector CT imaging of the abdomen and pelvis was performed using the standard protocol following bolus administration of intravenous contrast.  CONTRAST:  OMNIPAQUE IOHEXOL 300 MG/ML  SOLN  COMPARISON:  April 01, 2014  FINDINGS: The liver, spleen, pancreas, adrenal glands and kidneys are normal. There is no hydronephrosis bilaterally. Patient is  status post prior cholecystectomy. There is atherosclerosis of the abdominal aorta without aneurysmal dilatation. There is no abdominal lymphadenopathy.  There is inflammation surrounding the proximal through distal descending colon particularly around the at diverticula noted throughout the descending colon. There is a segment of descending colon that shows diffuse bowel wall thickening in the mid to lower descending colon. There is no small bowel obstruction. The appendix is normal.  Fluid-filled bladder is normal. Bilateral inguinal herniation of mesenteric fat are noted. There is no pelvic lymphadenopathy. The visualized lung bases are clear. Degenerative joint changes of the spine are noted.  IMPRESSION: Findings in the descending colon which can be due to acute diverticulitis. There is a segment of descending colon that shows diffuse bowel wall thickening in the mid to lower descending colon. Recommend repeat exam following treatment to ensure complete resolution and to exclude underlying mass.   Electronically Signed   By: Sherian Rein M.D.   On: 04/27/2015 16:24    Microbiology: Recent Results (from the past 240 hour(s))  Urine culture     Status: None   Collection Time: 04/27/15  5:40 PM  Result Value Ref Range Status   Specimen Description URINE, CLEAN CATCH  Final   Special Requests NONE  Final   Culture NO GROWTH 2 DAYS  Final   Report Status 04/29/2015 FINAL  Final  Culture, blood (routine x 2)     Status: None (Preliminary result)   Collection Time: 04/27/15  7:07 PM  Result Value Ref Range Status   Specimen Description BLOOD LEFT ANTECUBITAL  Final   Special Requests BOTTLES DRAWN AEROBIC AND ANAEROBIC 10CC EACH  Final   Culture NO GROWTH 2 DAYS  Final   Report Status PENDING  Incomplete  Culture, blood (routine x 2)     Status: None (Preliminary result)   Collection Time: 04/27/15  7:13 PM  Result Value Ref Range Status   Specimen Description BLOOD LEFT FOREARM  Final    Special Requests IN PEDIATRIC BOTTLE 4CC  Final   Culture NO GROWTH 2 DAYS  Final   Report Status PENDING  Incomplete  Urine culture     Status: None   Collection Time: 04/27/15  7:33 PM  Result Value Ref Range Status   Specimen Description URINE, RANDOM  Final   Special Requests NONE  Final   Culture NO GROWTH 2 DAYS  Final   Report Status 04/29/2015 FINAL  Final  Clostridium Difficile by PCR (not at [facility])     Status: None   Collection Time: 04/27/15  9:03 PM  Result Value Ref Range Status   C difficile by pcr NEGATIVE NEGATIVE Final  Stool culture     Status: None (Preliminary result)   Collection Time: 04/27/15  9:03 PM  Result Value Ref Range Status   Specimen Description STOOL  Final   Special Requests NONE  Final   Culture   Final    NO SUSPICIOUS COLONIES, CONTINUING TO HOLD Performed at [facility]    Report Status PENDING  Incomplete     Labs: Basic Metabolic Panel:  Recent Labs Lab 04/27/15 1356 04/28/15 0355 04/30/15 0336  NA 136 139 142  K 3.8 4.1 4.4  CL 99* 103 105  CO2 28 28 29   GLUCOSE 91 106* 95  BUN 17 13 10   CREATININE 1.22 1.24 1.29*  CALCIUM 9.0 8.8* 9.2   Liver Function Tests: No results for input(s): AST, ALT, ALKPHOS, BILITOT, PROT, ALBUMIN in the last 168 hours.  Recent Labs Lab 04/27/15 1918  LIPASE 20*   No results for input(s): AMMONIA in the last 168 hours. CBC:  Recent Labs Lab 04/27/15 1356 04/28/15 0355  WBC 14.7* 11.6*  NEUTROABS 11.2*  --   HGB 12.9* 13.1  HCT 37.3* 38.3*  MCV 89.4 89.3  PLT 225 218   Cardiac Enzymes: No results for input(s): CKTOTAL, CKMB, CKMBINDEX, TROPONINI in the last 168 hours. BNP: BNP (last 3 results) No results for input(s): BNP in the last 8760 hours.  ProBNP (last 3 results) No results for input(s): PROBNP in the last 8760 hours.  CBG: No results for input(s): GLUCAP in the last 168 hours.      Signed:  Marcellus Scott, MD, FACP, FHM. [facility] Pager  [phone]  If 7PM-7AM, please contact night-coverage [email or web address] Password [email or web address] 04/30/2015, 11:51 AM

## 2015-04-30 NOTE — Progress Notes (Signed)
Pt discharged to home.  Discharge instructions explained to pt.  Pt has no questions at the time of discharge.  Pt states he has all pt belongings.  IV dc'd/  Pt ambulated out of hospital on own.

## 2015-05-01 LAB — STOOL CULTURE

## 2015-05-02 LAB — CULTURE, BLOOD (ROUTINE X 2)
CULTURE: NO GROWTH
Culture: NO GROWTH

## 2015-05-02 LAB — GI PATHOGEN PANEL BY PCR, STOOL
C DIFFICILE TOXIN A/B: NOT DETECTED
CAMPYLOBACTER BY PCR: NOT DETECTED
CRYPTOSPORIDIUM BY PCR: NOT DETECTED
E COLI 0157 BY PCR: NOT DETECTED
E coli (ETEC) LT/ST: NOT DETECTED
E coli (STEC): NOT DETECTED
G LAMBLIA BY PCR: NOT DETECTED
Norovirus GI/GII: NOT DETECTED
Rotavirus A by PCR: NOT DETECTED
SALMONELLA BY PCR: NOT DETECTED
SHIGELLA BY PCR: NOT DETECTED

## 2015-06-05 ENCOUNTER — Encounter: Payer: Self-pay | Admitting: Cardiology

## 2015-09-13 ENCOUNTER — Ambulatory Visit (INDEPENDENT_AMBULATORY_CARE_PROVIDER_SITE_OTHER): Payer: Managed Care, Other (non HMO) | Admitting: Cardiology

## 2015-09-13 ENCOUNTER — Encounter: Payer: Self-pay | Admitting: Cardiology

## 2015-09-13 VITALS — BP 140/82 | HR 83 | Ht 71.0 in | Wt 232.1 lb

## 2015-09-13 DIAGNOSIS — Z9989 Dependence on other enabling machines and devices: Principal | ICD-10-CM

## 2015-09-13 DIAGNOSIS — G4733 Obstructive sleep apnea (adult) (pediatric): Secondary | ICD-10-CM | POA: Diagnosis not present

## 2015-09-13 DIAGNOSIS — E669 Obesity, unspecified: Secondary | ICD-10-CM | POA: Diagnosis not present

## 2015-09-13 NOTE — Patient Instructions (Signed)
Medication Instructions:  Your physician recommends that you continue on your current medications as directed. Please refer to the Current Medication list given to you today.   Labwork: None  Testing/Procedures: None  Follow-Up: Your physician wants you to follow-up in: 1 year with Dr. Mayford Knife. You will receive a reminder letter in the mail two months in advance. If you don't receive a letter, please call our office to schedule the follow-up appointment.   Any Other Special Instructions Will Be Listed Below (If Applicable). Our office OSA Assistant is Watkins, CMA. Her direct number is [phone] if you have any questions or concerns about your PAP.     If you need a refill on your cardiac medications before your next appointment, please call your pharmacy.

## 2015-09-13 NOTE — Progress Notes (Signed)
Cardiology Office Note   Date:  09/13/2015   ID:  Allen Whitney, DOB Jun 21, 1961, MRN [id number]  PCP:   [name], MD    Chief Complaint  Patient presents with  . Sleep Apnea      History of Present Illness: Allen Whitney is a 54 y.o. male with a history of OSA who presents today for followup. He is doing well. He tolerates his CPAP well.  He has no problems with the nasal pillow mask with chin strap and feels the pressure is adequate.  For the most part he feels rested in the am but not as well as he did when he started and has no daytime sleepiness. He gets up more at night to go to the bathroom. He has no mouth or nasal dryness.  Past Medical History  Diagnosis Date  . Renal insufficiency, mild 07/07/2013  . Gout attack 07/07/2013  . Diverticulitis   . PE (pulmonary embolism)     left lung  . Sleep apnea     uses CPAP at night    Past Surgical History  Procedure Laterality Date  . Tonsillectomy    . Cholecystectomy N/A 07/01/2013    Procedure: LAPAROSCOPIC CHOLECYSTECTOMY;  Surgeon: Adolph Pollack, MD;  Location: [facility];  Service: General;  Laterality: N/A;  . Umbilical hernia repair N/A 07/01/2013    Procedure: HERNIA REPAIR UMBILICAL ADULT;  Surgeon: Adolph Pollack, MD;  Location: [facility];  Service: General;  Laterality: N/A;     Current Outpatient Prescriptions  Medication Sig Dispense Refill  . meloxicam (MOBIC) 15 MG tablet Take 15 mg by mouth daily.    . Multiple Vitamins-Minerals (MULTIVITAMIN WITH MINERALS) tablet Take 1 tablet by mouth daily.     No current facility-administered medications for this visit.    Allergies:   Review of patient's allergies indicates no known allergies.    Social History:  The patient  reports that he has never smoked. He does not have any smokeless tobacco history on file. He reports that he does not drink alcohol or use illicit drugs.   Family History:  The patient's family history includes  Cataracts in his father; Heart attack in his mother; Macular degeneration in his mother.    ROS:  Please see the history of present illness.   Otherwise, review of systems are positive for none.   All other systems are reviewed and negative.    PHYSICAL EXAM: VS:  BP 140/82 mmHg  Pulse 83  Ht  (1.803 m)  Wt 105.291 kg (232 lb 2 oz)  BMI 32.39 kg/m2 , BMI Body mass index is 32.39 kg/(m^2). GEN: Well nourished, well developed, in no acute distress HEENT: normal Neck: no JVD, carotid bruits, or masses Cardiac: RRR; no murmurs, rubs, or gallops,no edema  Respiratory:  clear to auscultation bilaterally, normal work of breathing GI: soft, nontender, nondistended, + BS MS: no deformity or atrophy Skin: warm and dry, no rash Neuro:  Strength and sensation are intact Psych: euthymic mood, full affect   EKG:  EKG is not ordered today.    Recent Labs: 04/28/2015: Hemoglobin 13.1; Platelets 218 04/30/2015: BUN 10; Creatinine, Ser 1.29*; Potassium 4.4; Sodium 142    Lipid Panel No results found for: CHOL, TRIG, HDL, CHOLHDL, VLDL, LDLCALC, LDLDIRECT    Wt Readings from Last 3 Encounters:  09/13/15 105.291 kg (232 lb 2 oz)  04/27/15 99.791 kg (  220 lb)  09/13/14 101.606 kg (224 lb)    ASSESSMENT AND PLAN:  1. OSA on CPAP and tolerating well. His download today showed an AHI of 0.7hr on 14cm H2O and 99% compliance in using more than 4 hours nightly. - continue current CPAP settings  2. Obesity  - I have encouraged him to follow a low fat diet and get back into exercise    Current medicines are reviewed at length with the patient today.  The patient does not have concerns regarding medicines.  The following changes have been made:  no change  Labs/ tests ordered today: See above Assessment and Plan No orders of the defined types were placed in this encounter.     Disposition:   FU with me in 1 years  Signed, Quintella Reichert, MD  09/13/2015 1:48 PM    [facility] [address] [place], [place], [place]  [address] Phone: [phone]; Fax: [phone]

## 2016-03-20 ENCOUNTER — Other Ambulatory Visit: Payer: Self-pay | Admitting: Family Medicine

## 2016-03-20 DIAGNOSIS — R911 Solitary pulmonary nodule: Secondary | ICD-10-CM

## 2016-04-01 ENCOUNTER — Other Ambulatory Visit: Payer: Managed Care, Other (non HMO)

## 2016-07-18 IMAGING — CT CT ABD-PELV W/ CM
2 of 5 series · 16 of 46 positions shown, 18 images · IV contrast (APPLIED)
Comparison: April 01, 2014

CLINICAL DATA: Left lower quadrant pain. Recent treatment for
diverticulitis.

EXAM:
CT ABDOMEN AND PELVIS WITH CONTRAST
TECHNIQUE: Multidetector CT imaging of the abdomen and pelvis was performed
using the standard protocol following bolus administration of
intravenous contrast.
CONTRAST:  100mL OMNIPAQUE IOHEXOL 300 MG/ML  SOLN

[Series 2: abd/ pelvis 5.0 i30f 1 · axial · 0.81mm/px · z∈[+1005,+1465]mm · 13 of 104 slices shown, 15 images]
[im 6/104  soft-tissue]
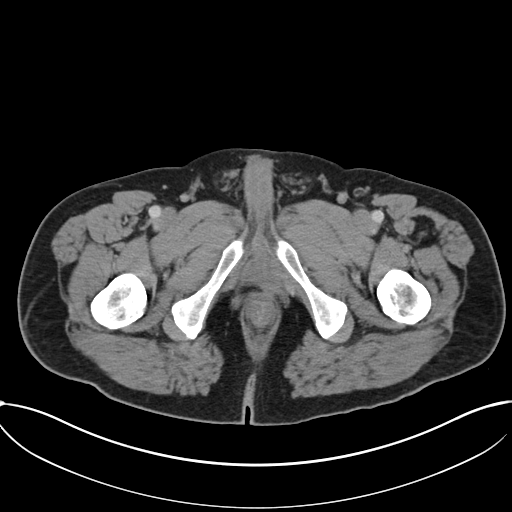
[im 6/104  bone]
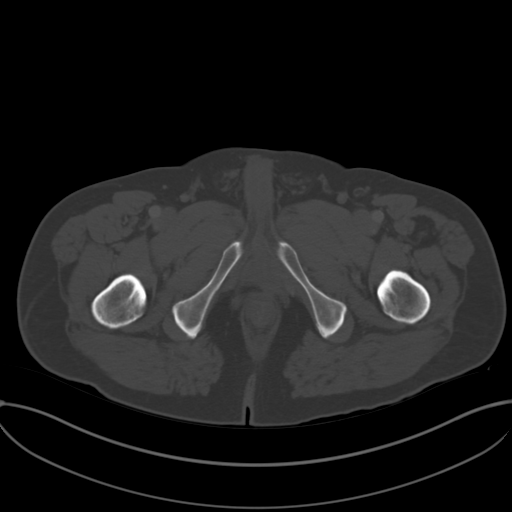
[im 16/104  soft-tissue]
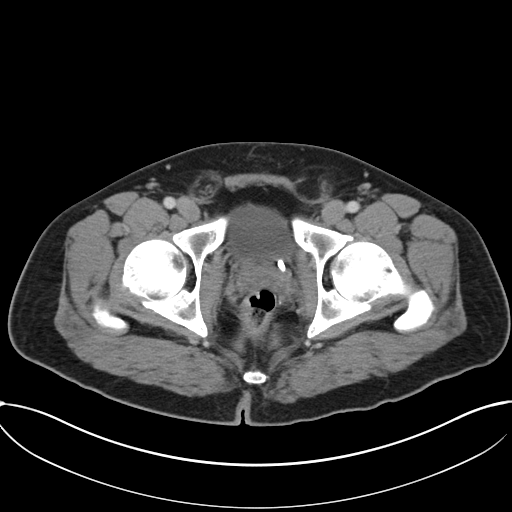
[im 21/104  soft-tissue]
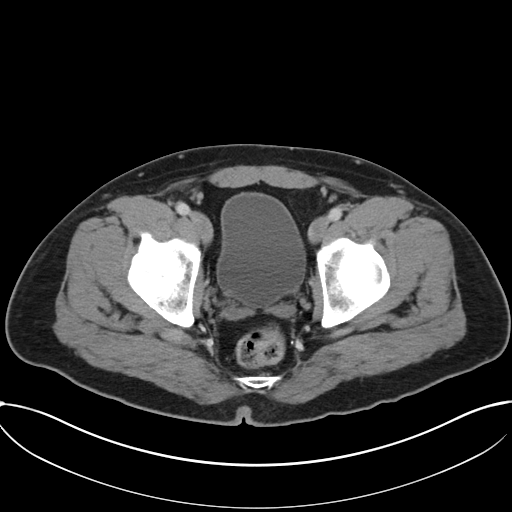
[im 31/104  soft-tissue]
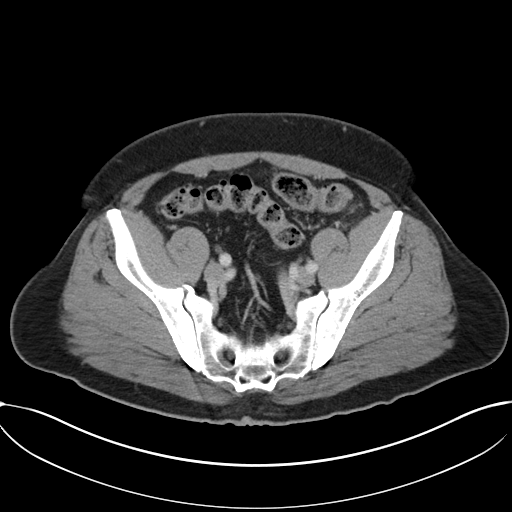
[im 37/104  soft-tissue]
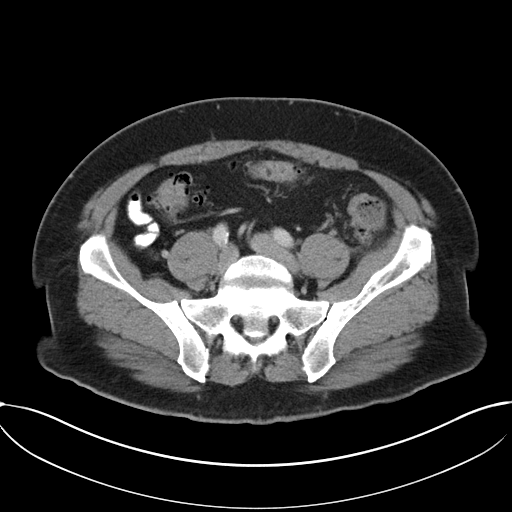
[im 47/104  soft-tissue]
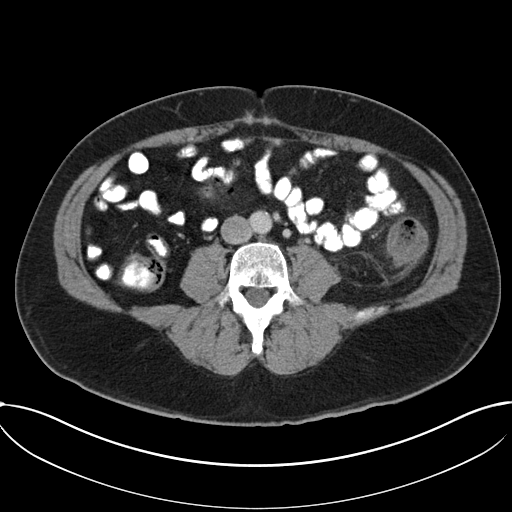
[im 52/104  soft-tissue]
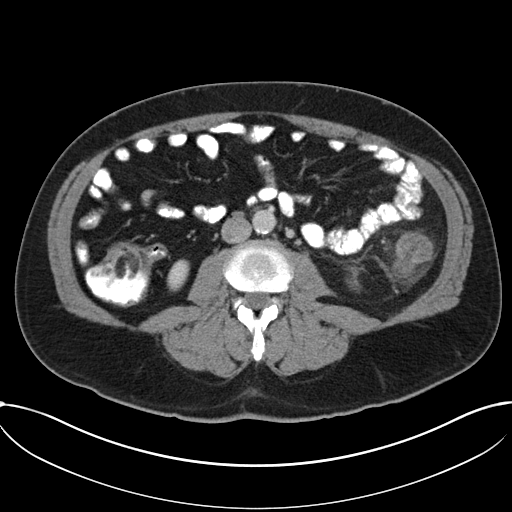
[im 57/104  soft-tissue]
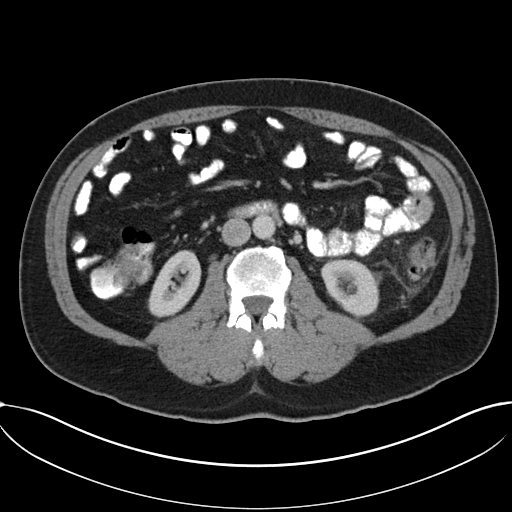
[im 67/104  soft-tissue]
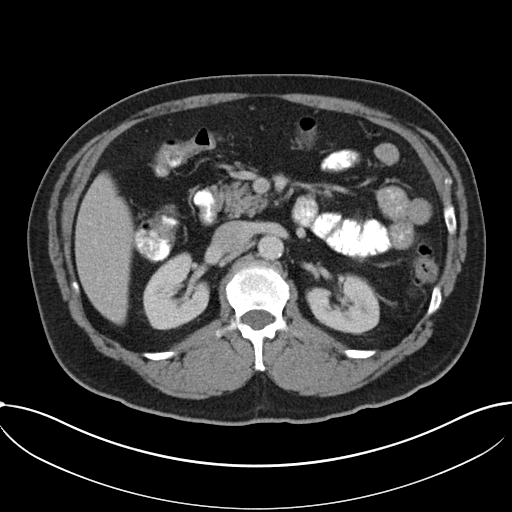
[im 67/104  bone]
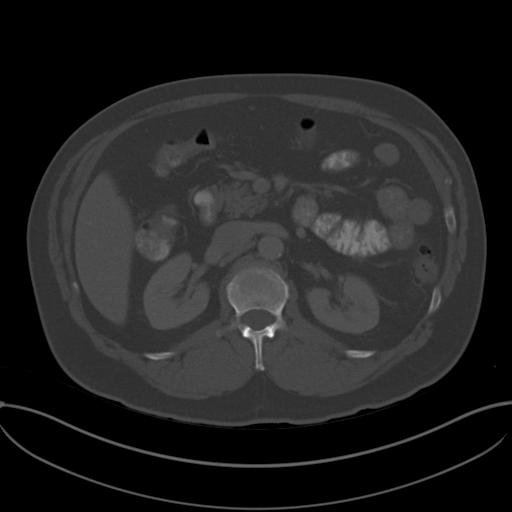
[im 73/104  soft-tissue]
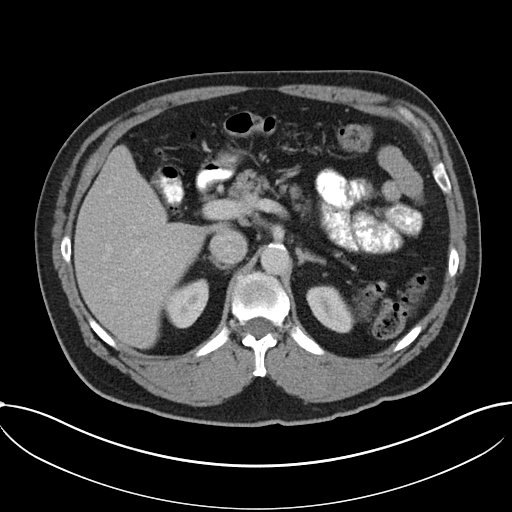
[im 83/104  soft-tissue]
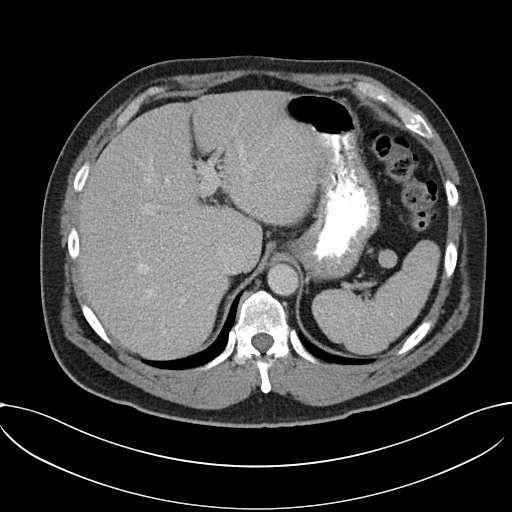
[im 88/104  soft-tissue]
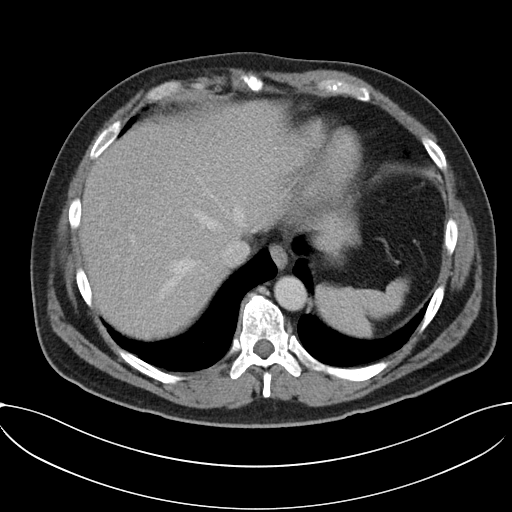
[im 98/104  soft-tissue]
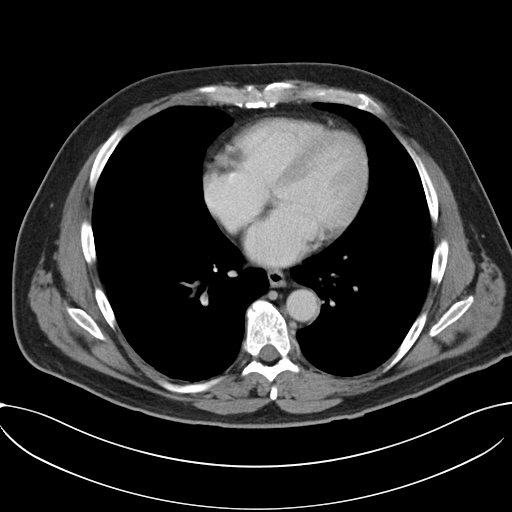

[Series 5: coronal soft tissue · coronal · 0.85mm/px · 3 of 101 slices shown]
[im 34/101  soft-tissue]
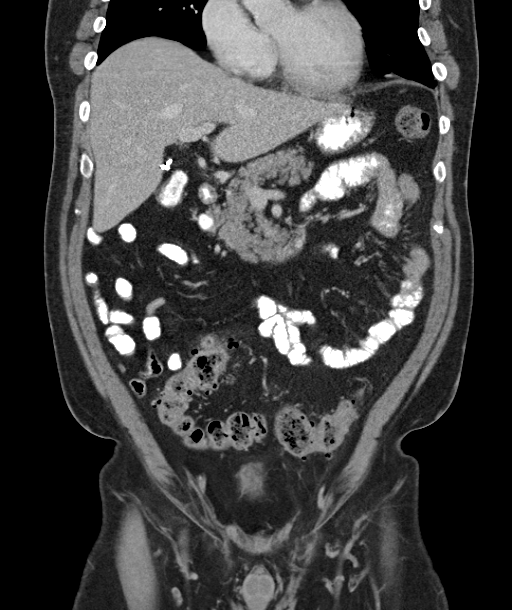
[im 45/101  soft-tissue]
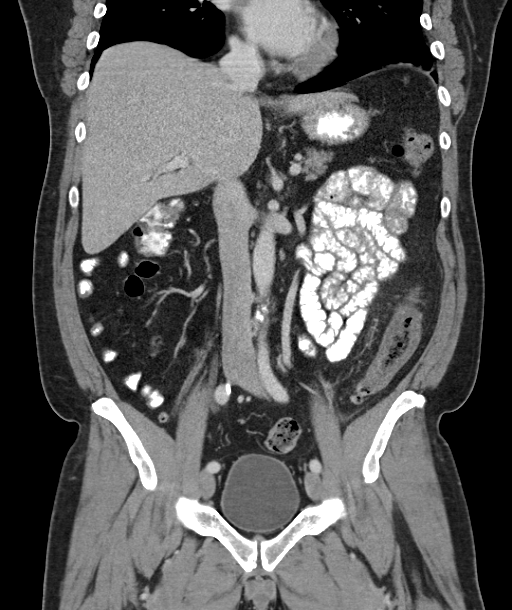
[im 56/101  soft-tissue]
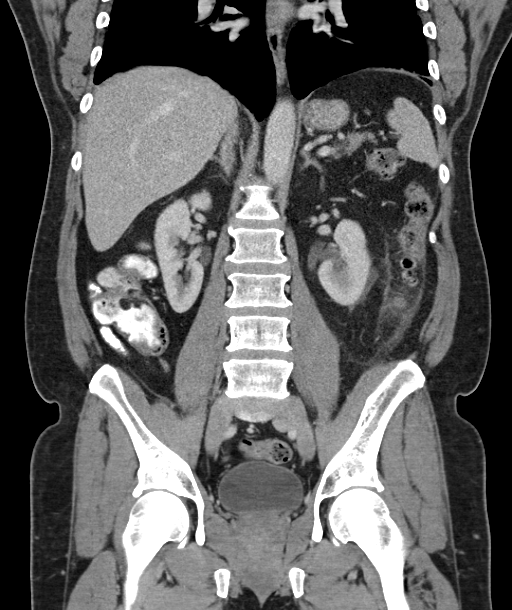

[16 of 46 positions shown; findings below may reference images not displayed]

FINDINGS: The liver, spleen, pancreas, adrenal glands and kidneys are normal.
There is no hydronephrosis bilaterally. Patient is status post prior
cholecystectomy. There is atherosclerosis of the abdominal aorta
without aneurysmal dilatation. There is no abdominal
lymphadenopathy.

There is inflammation surrounding the proximal through distal
descending colon particularly around the at diverticula noted
throughout the descending colon. There is a segment of descending
colon that shows diffuse bowel wall thickening in the mid to lower
descending colon. There is no small bowel obstruction. The appendix
is normal.

Fluid-filled bladder is normal. Bilateral inguinal herniation of
mesenteric fat are noted. There is no pelvic lymphadenopathy. The
visualized lung bases are clear. Degenerative joint changes of the
spine are noted.
IMPRESSION: Findings in the descending colon which can be due to acute
diverticulitis. There is a segment of descending colon that shows
diffuse bowel wall thickening in the mid to lower descending colon.
Recommend repeat exam following treatment to ensure complete
resolution and to exclude underlying mass.

## 2016-09-10 ENCOUNTER — Encounter: Payer: Self-pay | Admitting: *Deleted

## 2016-09-21 ENCOUNTER — Ambulatory Visit: Payer: Managed Care, Other (non HMO) | Admitting: Cardiology

## 2016-11-14 NOTE — Progress Notes (Signed)
Cardiology Office Note    Date:  11/16/2016   ID:  Brandan Vannorman, DOB 11-05-1960, MRN [id number]  PCP:  [name], MD  Cardiologist:  Armanda Magic, MD   Chief Complaint  Patient presents with  . Sleep Apnea    History of Present Illness:  Allen Whitney is a 56 y.o. male with a history of OSA who presents today for followup. He is doing well. He tolerates his CPAP well.  He has no problems with the nasal pillow mask with chin strap and feels the pressure is adequate.  Some days he feels more rested than others but is not sleeping well due to arthritic pain in his knee.  He gets up more at night to go to the bathroom. He has some mouth dryness but no nasal dryness.  He has had some problems with nasal congestion but says that the CPAP moisture actually helps.  He says that he has had some problems with palpitations back in the Fall when he was moving his daughter.  He has not had any further problems since September.      Past Medical History:  Diagnosis Date  . Diverticulitis   . Gout attack 07/07/2013  . PE (pulmonary embolism)    left lung  . Renal insufficiency, mild 07/07/2013  . Sleep apnea    uses CPAP at night    Past Surgical History:  Procedure Laterality Date  . CHOLECYSTECTOMY N/A 07/01/2013   Procedure: LAPAROSCOPIC CHOLECYSTECTOMY;  Surgeon: Adolph Pollack, MD;  Location: [facility];  Service: General;  Laterality: N/A;  . TONSILLECTOMY    . UMBILICAL HERNIA REPAIR N/A 07/01/2013   Procedure: HERNIA REPAIR UMBILICAL ADULT;  Surgeon: Adolph Pollack, MD;  Location: [facility];  Service: General;  Laterality: N/A;    Current Medications: Outpatient Medications Prior to Visit  Medication Sig Dispense Refill  . Multiple Vitamins-Minerals (MULTIVITAMIN WITH MINERALS) tablet Take 1 tablet by mouth daily.    . meloxicam (MOBIC) 15 MG tablet Take 15 mg by mouth daily.     No facility-administered medications prior to visit.      Allergies:   Patient has no known  allergies.   Social History   Social History  . Marital status: Married    Spouse name: N/A  . Number of children: N/A  . Years of education: N/A   Social History Main Topics  . Smoking status: Never Smoker  . Smokeless tobacco: Never Used  . Alcohol use No  . Drug use: No  . Sexual activity: Yes   Other Topics Concern  . None   Social History Narrative  . None     Family History:  The patient's family history includes Cataracts in his father; Heart attack in his mother; Macular degeneration in his mother.   ROS:   Please see the history of present illness.    ROS All other systems reviewed and are negative.  No flowsheet data found.     PHYSICAL EXAM:   VS:  BP 140/82   Pulse 83   Ht 5\' 11"  (1.803 m)   Wt 227 lb 6.4 oz (103.1 kg)   SpO2 96%   BMI 31.72 kg/m    GEN: Well nourished, well developed, in no acute distress  HEENT: normal  Neck: no JVD, carotid bruits, or masses Cardiac: RRR; no murmurs, rubs, or gallops,no edema.  Intact distal pulses bilaterally.  Respiratory:  clear to auscultation bilaterally, normal work of breathing GI: soft, nontender, nondistended, +  BS MS: no deformity or atrophy  Skin: warm and dry, no rash Neuro:  Alert and Oriented x 3, Strength and sensation are intact Psych: euthymic mood, full affect  Wt Readings from Last 3 Encounters:  11/16/16 227 lb 6.4 oz (103.1 kg)  09/13/15 232 lb 2 oz (105.3 kg)  04/27/15 220 lb (99.8 kg)      Studies/Labs Reviewed:   EKG:  EKG is not ordered today.    Recent Labs: No results found for requested labs within last 8760 hours.   Lipid Panel No results found for: CHOL, TRIG, HDL, CHOLHDL, VLDL, LDLCALC, LDLDIRECT  Additional studies/ records that were reviewed today include:  CPAP download    ASSESSMENT:    1. OSA on CPAP   2. Obesity (BMI 30-39.9)      PLAN:  In order of problems listed above:  OSA - the patient is tolerating PAP therapy well without any problems. The  PAP download was reviewed today and showed an AHI of 0.6/hr on 14cm H2O with 97% compliance in using more than 4 hours nightly.  The patient has been using and benefiting from CPAP use and will continue to benefit from therapy.  Obesity - I have encouraged him to get into a routine exercise program and cut back on carbs and portions.      Medication Adjustments/Labs and Tests Ordered: Current medicines are reviewed at length with the patient today.  Concerns regarding medicines are outlined above.  Medication changes, Labs and Tests ordered today are listed in the Patient Instructions below.  There are no Patient Instructions on file for this visit.   Signed, Armanda Magic, MD  11/16/2016 10:24 AM    [facility] [address] [place], [place], [place]  [address] Phone: [phone]; Fax: [phone]

## 2016-11-16 ENCOUNTER — Encounter: Payer: Self-pay | Admitting: Cardiology

## 2016-11-16 ENCOUNTER — Telehealth: Payer: Self-pay | Admitting: Cardiology

## 2016-11-16 ENCOUNTER — Ambulatory Visit (INDEPENDENT_AMBULATORY_CARE_PROVIDER_SITE_OTHER): Payer: Managed Care, Other (non HMO) | Admitting: Cardiology

## 2016-11-16 VITALS — BP 140/82 | HR 83 | Ht 71.0 in | Wt 227.4 lb

## 2016-11-16 DIAGNOSIS — E669 Obesity, unspecified: Secondary | ICD-10-CM

## 2016-11-16 DIAGNOSIS — G4733 Obstructive sleep apnea (adult) (pediatric): Secondary | ICD-10-CM | POA: Diagnosis not present

## 2016-11-16 DIAGNOSIS — Z9989 Dependence on other enabling machines and devices: Secondary | ICD-10-CM | POA: Diagnosis not present

## 2016-11-16 NOTE — Telephone Encounter (Signed)
New Message     Trying to provide you with the CPAP machine data , and [facility] said they do not have current information, please call him

## 2016-11-16 NOTE — Patient Instructions (Signed)

## 2016-11-17 NOTE — Telephone Encounter (Signed)
Follow Up:   Pt says he is waiting to hear something and you can reach him on his cell phone-[phone].

## 2016-11-18 ENCOUNTER — Encounter: Payer: Self-pay | Admitting: Cardiology

## 2016-11-20 NOTE — Telephone Encounter (Signed)
The patient called to see when he could bring his chip by for a download, he will call me when he is coming so I can download it and give it back

## 2017-04-11 ENCOUNTER — Encounter: Payer: Self-pay | Admitting: Cardiology

## 2017-04-12 ENCOUNTER — Telehealth: Payer: Self-pay | Admitting: *Deleted

## 2017-04-12 NOTE — Telephone Encounter (Signed)
Download sent to medical records to scan

## 2017-04-22 ENCOUNTER — Telehealth: Payer: Self-pay | Admitting: *Deleted

## 2017-04-22 NOTE — Telephone Encounter (Signed)
-----   Message from Quintella Reichert, MD sent at 04/13/2017  7:21 PM EDT ----- Good AHI and compliance.  Continue current CPAP settings.

## 2017-04-22 NOTE — Telephone Encounter (Signed)
Informed patient of compliance results and he verbalized understanding. Patient understands his settings will not change. Patient was grateful for the call and thanked me. 

## 2017-11-16 ENCOUNTER — Encounter: Payer: Self-pay | Admitting: Cardiology

## 2017-11-16 NOTE — Progress Notes (Signed)
Cardiology Office Note:    Date:  11/17/2017   ID:  Allen Whitney, DOB 03/26/1961, MRN [id number]  PCP:  Daisy Floro, MD  Cardiologist:  Armanda Magic, MD    Referring MD: Daisy Floro, MD   Chief Complaint  Patient presents with  . Sleep Apnea    History of Present Illness:    Allen Whitney is a 57 y.o. male with a hx of OSA on CPAP.  He is doing well with his CPAP device .  He tolerates the nasal mask with chin strap and feels the pressure is adequate.  Since going on CPAP he feels rested in the am and has no significant daytime sleepiness.  He denies any significant mouth or nasal dryness or nasal congestion.  He does not think that he snores.  He also states that he has been having some palpitations on occasion.  He had one episode that was very bad and made him feel weak and he had to sit down.  Unfortunately the episodes are very sporadic and may go months until another one occurs. He also complains that when he goes out in the cold weather he gets chest tightness.  This is new for him.   Past Medical History:  Diagnosis Date  . Diverticulitis   . Gout attack 07/07/2013  . PE (pulmonary embolism)    left lung  . Renal insufficiency, mild 07/07/2013  . Sleep apnea    uses CPAP at night    Past Surgical History:  Procedure Laterality Date  . CHOLECYSTECTOMY N/A 07/01/2013   Procedure: LAPAROSCOPIC CHOLECYSTECTOMY;  Surgeon: Adolph Pollack, MD;  Location: [facility];  Service: General;  Laterality: N/A;  . TONSILLECTOMY    . UMBILICAL HERNIA REPAIR N/A 07/01/2013   Procedure: HERNIA REPAIR UMBILICAL ADULT;  Surgeon: Adolph Pollack, MD;  Location: [facility];  Service: General;  Laterality: N/A;    Current Medications: Current Meds  Medication Sig  . glucosamine-chondroitin 500-400 MG tablet Take 1 tablet by mouth daily.  . Multiple Vitamins-Minerals (MULTIVITAMIN WITH MINERALS) tablet Take 1 tablet by mouth daily.  . naproxen sodium (ANAPROX) 220 MG tablet  Take 220 mg by mouth as needed (for pain).     Allergies:   Patient has no known allergies.   Social History   Socioeconomic History  . Marital status: Married    Spouse name: None  . Number of children: None  . Years of education: None  . Highest education level: None  Social Needs  . Financial resource strain: None  . Food insecurity - worry: None  . Food insecurity - inability: None  . Transportation needs - medical: None  . Transportation needs - non-medical: None  Occupational History  . None  Tobacco Use  . Smoking status: Never Smoker  . Smokeless tobacco: Never Used  Substance and Sexual Activity  . Alcohol use: No  . Drug use: No  . Sexual activity: Yes  Other Topics Concern  . None  Social History Narrative  . None     Family History: The patient's family history includes Cataracts in his father; Heart attack in his mother; Macular degeneration in his mother.  ROS:   Please see the history of present illness.    ROS  All other systems reviewed and negative.   EKGs/Labs/Other Studies Reviewed:    The following studies were reviewed today: CPAP download  EKG:  EKG is not ordered today.   Recent Labs: No results found for requested labs  within last 8760 hours.   Recent Lipid Panel No results found for: CHOL, TRIG, HDL, CHOLHDL, VLDL, LDLCALC, LDLDIRECT  Physical Exam:    VS:  BP (!) 154/78   Pulse 72   Ht 5\' 10"  (1.778 m)   Wt 222 lb 9.6 oz (101 kg)   SpO2 99%   BMI 31.94 kg/m     Wt Readings from Last 3 Encounters:  11/17/17 222 lb 9.6 oz (101 kg)  11/16/16 227 lb 6.4 oz (103.1 kg)  09/13/15 232 lb 2 oz (105.3 kg)     GEN:  Well nourished, well developed in no acute distress HEENT: Normal NECK: No JVD; No carotid bruits LYMPHATICS: No lymphadenopathy CARDIAC: RRR, no murmurs, rubs, gallops RESPIRATORY:  Clear to auscultation without rales, wheezing or rhonchi  ABDOMEN: Soft, non-tender, non-distended MUSCULOSKELETAL:  No edema; No  deformity  SKIN: Warm and dry NEUROLOGIC:  Alert and oriented x 3 PSYCHIATRIC:  Normal affect   ASSESSMENT:    1. OSA on CPAP   2. Obesity (BMI 30-39.9)   3. Chest pain, unspecified type   4. Heart palpitations    PLAN:    In order of problems listed above:  1.  OSA - the patient is tolerating PAP therapy well without any problems. The PAP download was reviewed today and showed an AHI of 0.7/hr on 14 cm H2O with 96% compliance in using more than 4 hours nightly.  The patient has been using and benefiting from PAP use and will continue to benefit from therapy.   2. Obesity - I have encouraged him to get into a routine exercise program and cut back on carbs and portions.   3.  Chest pain - this does not occur with exertion and he only notices it when he goes out in the cold air.  I will get an ETT to rule out ischemia.   4.  Palpitations - these are very sporadic and may be difficult to catch on heart monitor.  He is concerned since his mom has afib.  I will get a 30 day event monitor.     Medication Adjustments/Labs and Tests Ordered: Current medicines are reviewed at length with the patient today.  Concerns regarding medicines are outlined above.  Orders Placed This Encounter  Procedures  . For home use only DME continuous positive airway pressure (CPAP)  . EXERCISE TOLERANCE TEST (ETT)  . CARDIAC EVENT MONITOR   No orders of the defined types were placed in this encounter.   Signed, Armanda Magic, MD  11/17/2017 9:00 AM    [facility]

## 2017-11-17 ENCOUNTER — Ambulatory Visit: Payer: BLUE CROSS/BLUE SHIELD | Admitting: Cardiology

## 2017-11-17 ENCOUNTER — Encounter: Payer: Self-pay | Admitting: Cardiology

## 2017-11-17 ENCOUNTER — Telehealth: Payer: Self-pay | Admitting: *Deleted

## 2017-11-17 VITALS — BP 154/78 | HR 72 | Ht 70.0 in | Wt 222.6 lb

## 2017-11-17 DIAGNOSIS — R079 Chest pain, unspecified: Secondary | ICD-10-CM | POA: Diagnosis not present

## 2017-11-17 DIAGNOSIS — R002 Palpitations: Secondary | ICD-10-CM

## 2017-11-17 DIAGNOSIS — E669 Obesity, unspecified: Secondary | ICD-10-CM | POA: Diagnosis not present

## 2017-11-17 DIAGNOSIS — G4733 Obstructive sleep apnea (adult) (pediatric): Secondary | ICD-10-CM

## 2017-11-17 DIAGNOSIS — Z9989 Dependence on other enabling machines and devices: Secondary | ICD-10-CM

## 2017-11-17 NOTE — Patient Instructions (Signed)
Medication Instructions:  Your physician recommends that you continue on your current medications as directed. Please refer to the Current Medication list given to you today.  If you need a refill on your cardiac medications, please contact your pharmacy first.  Labwork: None ordered   Testing/Procedures: Your physician has requested that you have an exercise tolerance test. For further information please visit [email or web address] Please also follow instruction sheet, as given.  Your physician has recommended that you wear an event monitor. Event monitors are medical devices that record the heart's electrical activity. Doctors most often [place] these monitors to diagnose arrhythmias. Arrhythmias are problems with the speed or rhythm of the heartbeat. The monitor is a small, portable device. You can wear one while you do your normal daily activities. This is usually used to diagnose what is causing palpitations/syncope (passing out).   Follow-Up: Your physician wants you to follow-up in: 1 year with Dr. Mayford Knife. You will receive a reminder letter in the mail two months in advance. If you don't receive a letter, please call our office to schedule the follow-up appointment.  Any Other Special Instructions Will Be Listed Below (If Applicable).  Order placed for CPAP, you should receive a call from home health agency. Call Nina CPAP assistant at [phone] with any questions or concerns.    If you need a refill on your cardiac medications before your next appointment, please call your pharmacy.

## 2017-11-17 NOTE — Telephone Encounter (Signed)
-----   Message from Phineas Semen, [place] sent at 11/17/2017 11:27 AM EST ----- Regarding: DME order DME order placed  Thanks  Rena

## 2017-11-17 NOTE — Telephone Encounter (Signed)
Order sent to [facility] via community message 

## 2017-11-30 ENCOUNTER — Ambulatory Visit (INDEPENDENT_AMBULATORY_CARE_PROVIDER_SITE_OTHER): Payer: BLUE CROSS/BLUE SHIELD

## 2017-11-30 DIAGNOSIS — R079 Chest pain, unspecified: Secondary | ICD-10-CM

## 2017-11-30 DIAGNOSIS — R002 Palpitations: Secondary | ICD-10-CM | POA: Diagnosis not present

## 2017-11-30 LAB — EXERCISE TOLERANCE TEST
CHL CUP RESTING HR STRESS: 67 {beats}/min
CHL RATE OF PERCEIVED EXERTION: 16
CSEPED: 7 min
CSEPHR: 88 %
CSEPPHR: 144 {beats}/min
Estimated workload: 8.5 METS
Exercise duration (sec): 0 s
MPHR: 162 {beats}/min

## 2017-12-01 ENCOUNTER — Telehealth: Payer: Self-pay

## 2017-12-01 DIAGNOSIS — R030 Elevated blood-pressure reading, without diagnosis of hypertension: Secondary | ICD-10-CM

## 2017-12-01 NOTE — Telephone Encounter (Signed)
Notes recorded by Phineas Semen, RN on 12/01/2017 at 8:58 AM EST Patient made aware of stress test results. Informed patient of Dr. Norris Cross recommendation for 24 hour BP monitor due to elevated BP during stress test. Patient stated he would call back to schedule appt. He is in agreement with plan and thanked me for the call.  Notes recorded by Quintella Reichert, MD on 12/01/2017 at 8:23 AM EST Normal stress test but BP was very elevated - please get a 24 hour BP monitor

## 2017-12-06 ENCOUNTER — Telehealth: Payer: Self-pay

## 2017-12-06 NOTE — Telephone Encounter (Signed)
Spoke with patient regarding the episode on his event monitor 2/8 @ 4:00 pm.  He recalls leaving work and feeling dizzy as he displayed Sinus Tach.    Spoke with Dr. Mayford Knife who said that there is no intervention at this time.  Informed patient.

## 2017-12-07 ENCOUNTER — Other Ambulatory Visit: Payer: Self-pay | Admitting: Cardiology

## 2017-12-07 ENCOUNTER — Ambulatory Visit (INDEPENDENT_AMBULATORY_CARE_PROVIDER_SITE_OTHER): Payer: BLUE CROSS/BLUE SHIELD

## 2017-12-07 ENCOUNTER — Encounter: Payer: Self-pay | Admitting: *Deleted

## 2017-12-07 DIAGNOSIS — I10 Essential (primary) hypertension: Secondary | ICD-10-CM

## 2017-12-07 DIAGNOSIS — R030 Elevated blood-pressure reading, without diagnosis of hypertension: Secondary | ICD-10-CM

## 2017-12-07 NOTE — Progress Notes (Signed)
Patient ID: Allen Whitney, male   DOB: [birth year], 57 y.o.   MRN: [id number] 24 hour ambulatory blood pressure monitor applied to patient using standard adult cuff.

## 2017-12-10 ENCOUNTER — Other Ambulatory Visit: Payer: Self-pay

## 2017-12-10 ENCOUNTER — Telehealth: Payer: Self-pay

## 2017-12-10 DIAGNOSIS — Z79899 Other long term (current) drug therapy: Secondary | ICD-10-CM

## 2017-12-10 MED ORDER — CHLORTHALIDONE 25 MG PO TABS: 25.0000 mg | ORAL_TABLET | Freq: Every day | ORAL | 0 refills | Status: DC

## 2017-12-10 NOTE — Telephone Encounter (Signed)
Per Dr. Mayford Knife BP too high per blood pressure monitor report, have patient start chlorithalidone 25 mg once a day, BMET in 1 week and HTN clinic 3-4 weeks.   Spoke with patient. Made patient aware of blood pressure results. Instructed patient to start Chlorthalidone 25 mg once and follow up for BMET in 1 week to monitor kidney function and follow up at the HTN clinic in 4 weeks. Patient in agreement with plan. Patient scheduled for BMET on 12/17/17 and HTN on 01/10/18.

## 2017-12-20 ENCOUNTER — Other Ambulatory Visit: Payer: BLUE CROSS/BLUE SHIELD | Admitting: *Deleted

## 2017-12-20 DIAGNOSIS — Z79899 Other long term (current) drug therapy: Secondary | ICD-10-CM

## 2017-12-20 LAB — BASIC METABOLIC PANEL
BUN/Creatinine Ratio: 15 (ref 9–20)
BUN: 22 mg/dL (ref 6–24)
CO2: 27 mmol/L (ref 20–29)
Calcium: 9.8 mg/dL (ref 8.7–10.2)
Chloride: 95 mmol/L — ABNORMAL LOW (ref 96–106)
Creatinine, Ser: 1.48 mg/dL — ABNORMAL HIGH (ref 0.76–1.27)
GFR calc Af Amer: 59 mL/min/{1.73_m2} — ABNORMAL LOW (ref >59)
GFR calc non Af Amer: 51 mL/min/{1.73_m2} — ABNORMAL LOW (ref >59)
GLUCOSE: 99 mg/dL (ref 65–99)
POTASSIUM: 4.4 mmol/L (ref 3.5–5.2)
SODIUM: 138 mmol/L (ref 134–144)

## 2017-12-22 ENCOUNTER — Telehealth: Payer: Self-pay

## 2017-12-22 DIAGNOSIS — R7989 Other specified abnormal findings of blood chemistry: Secondary | ICD-10-CM

## 2017-12-22 MED ORDER — AMLODIPINE BESYLATE 5 MG PO TABS: 2.5000 mg | ORAL_TABLET | Freq: Every day | ORAL | 0 refills | Status: DC

## 2017-12-22 NOTE — Telephone Encounter (Signed)
-----   Message from Debbe Bales sent at 12/21/2017 12:07 PM EST ----- Routed to [facility]

## 2017-12-22 NOTE — Telephone Encounter (Signed)
Patient made aware of lab results. Patient instructed to STOP chlorthalidone and START amlodipine 2.5 mg daily. Patient stated he will go to [facility] on 12/29/17 for a repeat BMET. Patient in agreement with plan and thankful for the call  Notes recorded by Quintella Reichert, MD on 12/20/2017 at 10:19 PM EST Creatinine bumped with diuretic. Stop chlorthalidone and start amlodipine 2.5mg  daily and followup as prior recommendation with HTN clinic - repeat BMET in 1 week

## 2017-12-27 ENCOUNTER — Telehealth: Payer: Self-pay | Admitting: Nurse Practitioner

## 2017-12-27 DIAGNOSIS — R002 Palpitations: Secondary | ICD-10-CM

## 2017-12-27 NOTE — Telephone Encounter (Signed)
Received abnormal monitor reading that shows 11 beat run of V tach on 3/1 @ 9:43 pm. Documentation on the paper states doctor on call was paged and spoken to on 3/2 @ 0535 but there is no documentation in the patient's chart. I left a message for the patient to call back to report how he is feeling. I reviewed the monitor with Dr. Graciela Husbands, DOD, because Dr. Mayford Knife is not in the office. He advised patient have an ECHO to determine his EF and start Metoprolol 25 mg BID. I am routing to Dr. Mayford Knife and her primary nurse, Lyda Perone, RN and will await call back from the patient.

## 2017-12-29 LAB — BASIC METABOLIC PANEL
BUN/Creatinine Ratio: 17 (ref 9–20)
BUN: 21 mg/dL (ref 6–24)
CO2: 27 mmol/L (ref 20–29)
Calcium: 10 mg/dL (ref 8.7–10.2)
Chloride: 105 mmol/L (ref 96–106)
Creatinine, Ser: 1.21 mg/dL (ref 0.76–1.27)
GFR calc non Af Amer: 66 mL/min/{1.73_m2} (ref >59)
GFR, EST AFRICAN AMERICAN: 76 mL/min/{1.73_m2} (ref >59)
Glucose: 101 mg/dL — ABNORMAL HIGH (ref 65–99)
Potassium: 4.4 mmol/L (ref 3.5–5.2)
Sodium: 144 mmol/L (ref 134–144)

## 2017-12-29 MED ORDER — METOPROLOL TARTRATE 25 MG PO TABS: 25.0000 mg | ORAL_TABLET | Freq: Two times a day (BID) | ORAL | 0 refills | Status: DC

## 2017-12-29 NOTE — Telephone Encounter (Signed)
Note revised

## 2017-12-29 NOTE — Telephone Encounter (Signed)
Spoke with patient. 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 During that time he felt winded, flutters and a pounding in his chest. Patient stated after he rested for a while the symptoms resolved. Informed patient that he results were reviewed by Dr. Graciela Husbands (DOD) and Dr. Mayford Knife. Per MD, they recommend an ECHO to assess EF and to START metoprolol 25 mg BID. Patient in agreement with plan and verbalized understanding, echo order to be scheduled. Patient has a f/u appt at the HTN clinic on 01/10/18

## 2018-01-05 ENCOUNTER — Telehealth: Payer: Self-pay

## 2018-01-05 ENCOUNTER — Telehealth: Payer: Self-pay | Admitting: Cardiology

## 2018-01-05 DIAGNOSIS — I428 Other cardiomyopathies: Secondary | ICD-10-CM

## 2018-01-05 NOTE — Telephone Encounter (Signed)
Notes recorded by Phineas Semen, RN on 01/05/2018 at 10:58 AM EDT Patient made aware of echo results and Dr.Turner's recommendation for cardiac MRI. Patient informed that we will cancel echo and order cardiac MRI to r/o RV dysplasia due to NSVT. Patient in agreement with treatment plan and thankful for the call   Notes recorded by Quintella Reichert, MD on 01/04/2018 at 9:39 PM EDT COrrection - please cancel 2D echo and get cardiac MRI with gad to assess LVF and rule out RV dysplasia given NSVT on monitor   Notes recorded by Quintella Reichert, MD on 01/04/2018 at 9:38 PM EDT Heart monitor showed NSR with occasional extra heart beats from top and bottom of heart. ETT showed no ishemia. Please get a 2D echo to make sure LVF is normal

## 2018-01-05 NOTE — Telephone Encounter (Signed)
Called patient and LVM to call me back regarding time of day for cardiac MRI.

## 2018-01-09 NOTE — Progress Notes (Signed)
Patient ID: Allen Whitney                 DOB: [birth year]                      MRN: [id number]     HPI: Allen Whitney is a 57 y.o. male patient of Dr. Mayford Knife who presents today for hypertension evaluation. PMH significant for OSA on CPAP and PE. He had a normal stress test , but his BP was very elevated. Ambulatory BP monitor was ordered and pressure was elevated on this as well. He was started on chlorthalidone 25mg  daily. His Scr bumped (1.29 in 2016 >> 1.48) on chlorthalidone and this was stopped. He was started on amlodipine 2.5mg  daily instead. After repeat BMET Scr back down to 1.21. Since this time he was also started on metoprolol 25mg  BID and cardiac MRI was ordered (which has not yet been performed).   He presents today alone in good spirits. He reports that his "flutter" is more frequent on amlodipine and metoprolol. He describes this "flutter" as the same feeling he was getting previously that he has discussed with Dr. Mayford Knife several times. He feels extremely tired recently. He states he has some chest pain with exertion. He reports that he is SOB when he gets up in the middle of the night - this is not new. He denies swelling in his legs. He denies dizziness recently.   Current HTN meds:  Amlodipine 2.5mg  daily - did take 1 tablet the first few days -  in the moring Metoprolol tartrate 25mg  BID (6-7am, 6-8pm)  Previously tried: chlorthalidone (Scr bump)  BP goal: <130/80  Family History: Cataracts in his father; Heart attack in his mother; Macular degeneration in his mother.  Social History: Denies tobacco and last alcohol in 1983.   Diet: Eats out occasionally. Does add salt to food. Could be better about vegetables. Drinks mostly water or vitamin water. He drinks soft drinks or tea occasionally. Decaf coffee in the morning occasionally.   Exercise: He walks weather permitting. He is active at work standing.   Home BP readings: Has not monitored since ambulatory blood  pressure monitoring.   Wt Readings from Last 3 Encounters:  11/17/17 222 lb 9.6 oz (101 kg)  11/16/16 227 lb 6.4 oz (103.1 kg)  09/13/15 232 lb 2 oz (105.3 kg)   BP Readings from Last 3 Encounters:  01/10/18 132/76  11/17/17 (!) 154/78  11/16/16 140/82   Pulse Readings from Last 3 Encounters:  01/10/18 70  11/17/17 72  11/16/16 83    Renal function: CrCl cannot be calculated (Unknown ideal weight.).  Past Medical History:  Diagnosis Date  . Diverticulitis   . Gout attack 07/07/2013  . Hypertension 01/10/2018  . PE (pulmonary embolism)    left lung  . Renal insufficiency, mild 07/07/2013  . Sleep apnea    uses CPAP at night    Current Outpatient Medications on File Prior to Visit  Medication Sig Dispense Refill  . amLODipine (NORVASC) 5 MG tablet Take 0.5 tablets (2.5 mg total) by mouth daily. 30 tablet 0  . glucosamine-chondroitin 500-400 MG tablet Take 1 tablet by mouth daily.    . metoprolol tartrate (LOPRESSOR) 25 MG tablet Take 1 tablet (25 mg total) by mouth 2 (two) times daily. 60 tablet 0  . Multiple Vitamins-Minerals (OCUVITE EYE HEALTH FORMULA PO) Take 1 tablet by mouth daily.     No current facility-administered medications on file prior to  visit.     No Known Allergies  Blood pressure 132/76, pulse 70.   Assessment/Plan: Hypertension: BP is borderline at goal. Discussed that lethargy can be associated with metoprolol, but should get better with time on medication. Have asked that he monitor symptoms (specifically the "flutter") and call with any changes. Will continue all medications as prescribed. Follow up in 2-4 weeks with Cardiac MRI due to coming from out of town.    Thank you, Freddie Apley. Cleatis Polka, PharmD  [facility]  01/10/2018 1:55 PM

## 2018-01-10 ENCOUNTER — Other Ambulatory Visit (HOSPITAL_COMMUNITY): Payer: BLUE CROSS/BLUE SHIELD

## 2018-01-10 ENCOUNTER — Ambulatory Visit (INDEPENDENT_AMBULATORY_CARE_PROVIDER_SITE_OTHER): Payer: BLUE CROSS/BLUE SHIELD | Admitting: Pharmacist

## 2018-01-10 ENCOUNTER — Encounter: Payer: Self-pay | Admitting: Cardiology

## 2018-01-10 DIAGNOSIS — I10 Essential (primary) hypertension: Secondary | ICD-10-CM | POA: Diagnosis not present

## 2018-01-10 HISTORY — DX: Essential (primary) hypertension: I10

## 2018-01-10 NOTE — Patient Instructions (Signed)
Return for a follow up appointment in 3-4 weeks  Your blood pressure goal is less than 130/80  Check your blood pressure at home daily (if able) and keep record of the readings.  Take your BP meds as follows: CONTINUE metoprolol tartrate 25mg  TWICE daily and amlodipine 2.5mg  daily   Bring all of your meds, your BP cuff and your record of home blood pressures to your next appointment.  Exercise as you're able, try to walk approximately 30 minutes per day.  Keep salt intake to a minimum, especially watch canned and prepared boxed foods.  Eat more fresh fruits and vegetables and fewer canned items.  Avoid eating in fast food restaurants.    HOW TO TAKE YOUR BLOOD PRESSURE: . Rest 5 minutes before taking your blood pressure. .  Don't smoke or drink caffeinated beverages for at least 30 minutes before. . Take your blood pressure before (not after) you eat. . Sit comfortably with your back supported and both feet on the floor (don't cross your legs). . Elevate your arm to heart level on a table or a desk. . Use the proper sized cuff. It should fit smoothly and snugly around your bare upper arm. There should be enough room to slip a fingertip under the cuff. The bottom edge of the cuff should be 1 inch above the crease of the elbow. . Ideally, take 3 measurements at one sitting and record the average.

## 2018-01-25 ENCOUNTER — Ambulatory Visit (INDEPENDENT_AMBULATORY_CARE_PROVIDER_SITE_OTHER): Payer: BLUE CROSS/BLUE SHIELD | Admitting: Pharmacist

## 2018-01-25 ENCOUNTER — Ambulatory Visit (HOSPITAL_COMMUNITY)
Admission: RE | Admit: 2018-01-25 | Discharge: 2018-01-25 | Disposition: A | Discharge status: Home / Self Care | Payer: BLUE CROSS/BLUE SHIELD | Source: Ambulatory Visit | Attending: Cardiology | Admitting: Cardiology

## 2018-01-25 VITALS — BP 128/74 | HR 80

## 2018-01-25 DIAGNOSIS — I472 Ventricular tachycardia: Secondary | ICD-10-CM | POA: Insufficient documentation

## 2018-01-25 DIAGNOSIS — I471 Supraventricular tachycardia: Secondary | ICD-10-CM | POA: Diagnosis not present

## 2018-01-25 DIAGNOSIS — I428 Other cardiomyopathies: Secondary | ICD-10-CM

## 2018-01-25 DIAGNOSIS — I10 Essential (primary) hypertension: Secondary | ICD-10-CM | POA: Diagnosis not present

## 2018-01-25 MED ORDER — GADOBENATE DIMEGLUMINE 529 MG/ML IV SOLN
33.0000 mL | Freq: Once | INTRAVENOUS | Status: AC | PRN
  Administered 2018-01-25: 33 mL via INTRAVENOUS

## 2018-01-25 NOTE — Patient Instructions (Addendum)
Your blood pressure goal is less than 130/80  Call the clinic if you have more dizziness or your pressure trends up. [phone].   Check your blood pressure at home daily (if able) and keep record of the readings.  Take your BP meds as follows: CONTINUE all medications as prescribed  Bring all of your meds, your BP cuff and your record of home blood pressures to your next appointment.  Exercise as you're able, try to walk approximately 30 minutes per day.  Keep salt intake to a minimum, especially watch canned and prepared boxed foods.  Eat more fresh fruits and vegetables and fewer canned items.  Avoid eating in fast food restaurants.    HOW TO TAKE YOUR BLOOD PRESSURE: . Rest 5 minutes before taking your blood pressure. .  Don't smoke or drink caffeinated beverages for at least 30 minutes before. . Take your blood pressure before (not after) you eat. . Sit comfortably with your back supported and both feet on the floor (don't cross your legs). . Elevate your arm to heart level on a table or a desk. . Use the proper sized cuff. It should fit smoothly and snugly around your bare upper arm. There should be enough room to slip a fingertip under the cuff. The bottom edge of the cuff should be 1 inch above the crease of the elbow. . Ideally, take 3 measurements at one sitting and record the average.

## 2018-01-25 NOTE — Progress Notes (Signed)
Patient ID: Allen Whitney                 DOB: 11-27-1960                      MRN: [id number]     HPI: Allen Whitney is a 57 y.o. male patient of Dr. Mayford Knife who presents today for hypertension evaluation. PMH significant for OSA on CPAP and PE. He had a normal stress test , but his BP was very elevated. Ambulatory BP monitor was ordered and pressure was elevated on this as well. He was started on chlorthalidone 25mg  daily. His Scr bumped (1.29 in 2016 >> 1.48) on chlorthalidone and this was stopped. He was started on amlodipine 2.5mg  daily instead. After repeat BMET Scr back down to 1.21. Since this time he was also started on metoprolol 25mg  BID and cardiac MRI was ordered (which has not yet been performed). At his most recent visit with HTN clinic his pressure was borderline at goal and all medications were continued as prescribed. He did report some lethargy and "fluttering" with amlodipine and metoprolol.   He presents today alone in good spirits. He is also scheduled for a Cardiac MRI today. He reports that 'fluttering' has decreased since our last visit and he is happy about that. He reports some tightness in his chest with extreme exertion, especially with use of arms over the last few months. He states he is always SOB with exertion. He reports that he did get dizzy one night and again one morning - where pictures and things on the wall were moving. He reports that he is extremely tired after work and just can't go the way he usually does recently. Otherwise he believes he is tolerating his medications well. He is anxiously awaiting the results of his cardiac MRI from later this afternoon.   Current HTN meds:  Amlodipine 2.5mg  daily  Metoprolol tartrate 25mg  BID (6-7am, 6-8pm)  Previously tried: chlorthalidone (Scr bump)  BP goal: <130/80  Family History: Cataracts in his father; Heart attack in his mother; Macular degeneration in his mother.  Social History: Denies tobacco and last  alcohol in 1983.   Diet: Eats out occasionally. Does add salt to food. Could be better about vegetables. Drinks mostly water or vitamin water. He drinks soft drinks or tea occasionally. Decaf coffee in the morning occasionally.   Exercise: He walks weather permitting. He is active at work standing.   Home BP readings: 140/80,150/76  Wt Readings from Last 3 Encounters:  11/17/17 222 lb 9.6 oz (101 kg)  11/16/16 227 lb 6.4 oz (103.1 kg)  09/13/15 232 lb 2 oz (105.3 kg)   BP Readings from Last 3 Encounters:  01/25/18 128/74  01/10/18 132/76  11/17/17 (!) 154/78   Pulse Readings from Last 3 Encounters:  01/25/18 80  01/10/18 70  11/17/17 72    Renal function: CrCl cannot be calculated (Patient's most recent lab result is older than the maximum 21 days allowed.).  Past Medical History:  Diagnosis Date  . Diverticulitis   . Gout attack 07/07/2013  . Hypertension 01/10/2018  . PE (pulmonary embolism)    left lung  . Renal insufficiency, mild 07/07/2013  . Sleep apnea    uses CPAP at night    Current Outpatient Medications on File Prior to Visit  Medication Sig Dispense Refill  . glucosamine-chondroitin 500-400 MG tablet Take 1 tablet by mouth daily.    . Multiple Vitamins-Minerals (OCUVITE EYE HEALTH FORMULA  PO) Take 1 tablet by mouth daily.     No current facility-administered medications on file prior to visit.     No Known Allergies  Blood pressure 128/74, pulse 80.   Assessment/Plan: Hypertension: BP today is at goal <130/80. Home pressures seem to be slightly above goal, but patient takes a manual measurement on himself. Will continue medications as prescribed for now given at goal in office and has been experiencing lethargy. Will await results of cardiac MRI as well. For now have asked he continue to monitor and call with changes. Follow up with Dr. Mayford Knife as scheduled and HTN clinic if needed before then for increased symptoms or increased pressures.    Thank  you, Freddie Apley. Cleatis Polka, PharmD  [facility]  01/26/2018 8:59 AM

## 2018-01-26 ENCOUNTER — Other Ambulatory Visit: Payer: Self-pay | Admitting: Cardiology

## 2018-01-26 ENCOUNTER — Encounter: Payer: Self-pay | Admitting: Pharmacist

## 2018-01-26 MED ORDER — AMLODIPINE BESYLATE 5 MG PO TABS: 2.5000 mg | ORAL_TABLET | Freq: Every day | ORAL | 3 refills | Status: DC

## 2018-06-03 DIAGNOSIS — Z125 Encounter for screening for malignant neoplasm of prostate: Secondary | ICD-10-CM | POA: Diagnosis not present

## 2018-06-03 DIAGNOSIS — Z13220 Encounter for screening for lipoid disorders: Secondary | ICD-10-CM | POA: Diagnosis not present

## 2018-06-03 DIAGNOSIS — Z0000 Encounter for general adult medical examination without abnormal findings: Secondary | ICD-10-CM | POA: Diagnosis not present

## 2018-07-07 DIAGNOSIS — H353211 Exudative age-related macular degeneration, right eye, with active choroidal neovascularization: Secondary | ICD-10-CM | POA: Diagnosis not present

## 2018-08-05 DIAGNOSIS — L570 Actinic keratosis: Secondary | ICD-10-CM | POA: Diagnosis not present

## 2018-08-05 DIAGNOSIS — C4401 Basal cell carcinoma of skin of lip: Secondary | ICD-10-CM | POA: Diagnosis not present

## 2018-08-05 DIAGNOSIS — C44519 Basal cell carcinoma of skin of other part of trunk: Secondary | ICD-10-CM | POA: Diagnosis not present

## 2018-08-05 DIAGNOSIS — L578 Other skin changes due to chronic exposure to nonionizing radiation: Secondary | ICD-10-CM | POA: Diagnosis not present

## 2018-08-05 DIAGNOSIS — L821 Other seborrheic keratosis: Secondary | ICD-10-CM | POA: Diagnosis not present

## 2018-11-11 ENCOUNTER — Other Ambulatory Visit: Payer: Self-pay | Admitting: Cardiology

## 2018-12-11 DIAGNOSIS — J111 Influenza due to unidentified influenza virus with other respiratory manifestations: Secondary | ICD-10-CM | POA: Diagnosis not present

## 2019-02-08 ENCOUNTER — Other Ambulatory Visit: Payer: Self-pay | Admitting: Cardiology

## 2019-02-15 ENCOUNTER — Ambulatory Visit: Payer: BLUE CROSS/BLUE SHIELD | Admitting: Cardiology

## 2019-02-23 ENCOUNTER — Other Ambulatory Visit: Payer: Self-pay

## 2019-02-23 ENCOUNTER — Encounter: Payer: Self-pay | Admitting: Cardiology

## 2019-02-23 ENCOUNTER — Telehealth: Payer: Self-pay | Admitting: *Deleted

## 2019-02-23 ENCOUNTER — Telehealth (INDEPENDENT_AMBULATORY_CARE_PROVIDER_SITE_OTHER): Payer: BLUE CROSS/BLUE SHIELD | Admitting: Cardiology

## 2019-02-23 ENCOUNTER — Telehealth: Payer: Self-pay

## 2019-02-23 VITALS — BP 160/86 | HR 66 | Ht 70.0 in | Wt 225.0 lb

## 2019-02-23 DIAGNOSIS — G4733 Obstructive sleep apnea (adult) (pediatric): Secondary | ICD-10-CM | POA: Diagnosis not present

## 2019-02-23 DIAGNOSIS — Z9989 Dependence on other enabling machines and devices: Secondary | ICD-10-CM

## 2019-02-23 DIAGNOSIS — E669 Obesity, unspecified: Secondary | ICD-10-CM | POA: Diagnosis not present

## 2019-02-23 DIAGNOSIS — I10 Essential (primary) hypertension: Secondary | ICD-10-CM | POA: Diagnosis not present

## 2019-02-23 NOTE — Patient Instructions (Signed)
Medication Instructions:  Your physician recommends that you continue on your current medications as directed. Please refer to the Current Medication list given to you today.  If you need a refill on your cardiac medications before your next appointment, please call your pharmacy.   Lab work: None If you have labs (blood work) drawn today and your tests are completely normal, you will receive your results only by: Marland Kitchen MyChart Message (if you have MyChart) OR . A paper copy in the mail If you have any lab test that is abnormal or we need to change your treatment, we will call you to review the results.  Testing/Procedures: None  Follow-Up: At [facility] you and your health needs are our priority.  As part of our continuing mission to provide you with exceptional heart care, we have created designated Provider Care Teams.  These Care Teams include your primary Cardiologist (physician) and Advanced Practice Providers (APPs -  Physician Assistants and Nurse Practitioners) who all work together to provide you with the care you need, when you need it. You will need a follow up appointment in 1 years.  Please call our office 2 months in advance to schedule this appointment.  You may see Armanda Magic, MD

## 2019-02-23 NOTE — Telephone Encounter (Signed)
-----   Message from Dustin Flock, RN sent at 02/23/2019  4:19 PM EDT ----- Regarding: FW: followup  ----- Message ----- From: Quintella Reichert, MD Sent: 02/23/2019   2:30 PM EDT To: Dustin Flock, RN Subject: followup                                       Order a chin strap and new PAP supplies.  Followup with me 1 year.  Get a PAP dowlnoad from DME

## 2019-02-23 NOTE — Progress Notes (Signed)
Virtual Visit via Video Note   This visit type was conducted due to national recommendations for restrictions regarding the COVID-19 Pandemic (e.g. social distancing) in an effort to limit this patient's exposure and mitigate transmission in our community.  Due to his co-morbid illnesses, this patient is at least at moderate risk for complications without adequate follow up.  This format is felt to be most appropriate for this patient at this time.  All issues noted in this document were discussed and addressed.  A limited physical exam was performed with this format.  Please refer to the patient's chart for his consent to telehealth for [facility].  Evaluation Performed:  Follow-up visit  This visit type was conducted due to national recommendations for restrictions regarding the COVID-19 Pandemic (e.g. social distancing).  This format is felt to be most appropriate for this patient at this time.  All issues noted in this document were discussed and addressed.  No physical exam was performed (except for noted visual exam findings with Video Visits).  Please refer to the patient's chart (MyChart message for video visits and phone note for telephone visits) for the patient's consent to telehealth for [facility].  Date:  02/23/2019   ID:  Allen Whitney, DOB [birth year], MRN [id number]  Patient Location:  Home  Provider location:   [place]  PCP:  Daisy Floro, MD  Cardiologist:  Armanda Magic, MD  Electrophysiologist:  None   Chief Complaint:  OSA  History of Present Illness:    Allen Whitney is a 58 y.o. male who presents via audio/video conferencing for a telehealth visit today.    Allen Whitney is a 58 y.o. male with a hx of OSA on CPAP.  He is doing well with his CPAP device.  He is doing well with his CPAP device and thinks that he has gotten used to it.  He tolerates the mask and feels the pressure is adequate.  Since going on CPAP he feels rested in the am and  has no significant daytime sleepiness.  He denies any significant mouth or nasal dryness or nasal congestion.  He does not think that he snores.    The patient does not have symptoms concerning for COVID-19 infection (fever, chills, cough, or new shortness of breath).   Prior CV studies:   The following studies were reviewed today:  none  Past Medical History:  Diagnosis Date  . Diverticulitis   . Gout attack 07/07/2013  . Hypertension 01/10/2018  . PE (pulmonary embolism)    left lung  . Renal insufficiency, mild 07/07/2013  . Sleep apnea    uses CPAP at night   Past Surgical History:  Procedure Laterality Date  . CHOLECYSTECTOMY N/A 07/01/2013   Procedure: LAPAROSCOPIC CHOLECYSTECTOMY;  Surgeon: Adolph Pollack, MD;  Location: [facility];  Service: General;  Laterality: N/A;  . TONSILLECTOMY    . UMBILICAL HERNIA REPAIR N/A 07/01/2013   Procedure: HERNIA REPAIR UMBILICAL ADULT;  Surgeon: Adolph Pollack, MD;  Location: [facility];  Service: General;  Laterality: N/A;     Current Meds  Medication Sig  . amLODipine (NORVASC) 5 MG tablet TAKE 0.5 TABLETS BY MOUTH DAILY. PLEASE KEEP UPCOMING APPT FOR FUTURE REFILLS. THANK YOU.  . metoprolol tartrate (LOPRESSOR) 25 MG tablet TAKE 1 TABLET BY MOUTH TWICE A DAY  . Multiple Vitamins-Minerals (OCUVITE EYE HEALTH FORMULA PO) Take 1 tablet by mouth daily.     Allergies:   Patient has no known allergies.   Social  History   Tobacco Use  . Smoking status: Never Smoker  . Smokeless tobacco: Never Used  Substance Use Topics  . Alcohol use: No  . Drug use: No     Family Hx: The patient's family history includes Cataracts in his father; Heart attack in his mother; Macular degeneration in his mother.  ROS:   Please see the history of present illness.    All other systems reviewed and are negative.   Labs/Other Tests and Data Reviewed:    Recent Labs: No results found for requested labs within last 8760 hours.   Recent Lipid Panel No  results found for: CHOL, TRIG, HDL, CHOLHDL, LDLCALC, LDLDIRECT  Wt Readings from Last 3 Encounters:  02/23/19 225 lb (102.1 kg)  11/17/17 222 lb 9.6 oz (101 kg)  11/16/16 227 lb 6.4 oz (103.1 kg)     Objective:    Vital Signs:  BP (!) 160/86   Pulse 66   Ht 5\' 10"  (1.778 m)   Wt 225 lb (102.1 kg)   BMI 32.28 kg/m    CONSTITUTIONAL:  Well nourished, well developed male in no acute distress.  EYES: anicteric MOUTH: oral mucosa is pink RESPIRATORY: Normal respiratory effort, symmetric expansion CARDIOVASCULAR: No peripheral edema SKIN: No rash, lesions or ulcers MUSCULOSKELETAL: no digital cyanosis NEURO: Cranial Nerves II-XII grossly intact, moves all extremities PSYCH: Intact judgement and insight.  A&O x 3, Mood/affect appropriate   ASSESSMENT & PLAN:    1.  OSA - the patient is tolerating PAP therapy well without any problems.  The patient has been using and benefiting from PAP use and will continue to benefit from therapy.  I will get a download from his DME.  He tells me his chinstrap is stretched out so I will order a new chinstrap and also order new supplies.  2.  Hypertension -his blood pressure is borderline controlled today on exam. At home his SBP runs about .  He says that he was rushing around prior to his visit. He will continue on amlodipine 2.5 mg daily and Lopressor 25 mg twice daily.  3.  Obesity - I have encouraged him to get into a routine exercise program and cut back on carbs and portions.   4.  OVID-19 Education:The signs and symptoms of COVID-19 were discussed with the patient and how to seek care for testing (follow up with PCP or arrange E-visit).  The importance of social distancing was discussed today.  Patient Risk:   After full review of this patient's clinical status, I feel that they are at least moderate risk at this time.  Time:   Today, I have spent 15 minutes directly with the patient on video discussing medical problems including  OSA, HTN and obesity with healthy diet and exercise.  We also reviewed the symptoms of COVID 19 and the ways to protect against contracting the virus with telehealth technology.  I spent an additional 5 minutes reviewing patient's chart including prior OV notes and compliance records for PAP in 2019.  Medication Adjustments/Labs and Tests Ordered: Current medicines are reviewed at length with the patient today.  Concerns regarding medicines are outlined above.  Tests Ordered: No orders of the defined types were placed in this encounter.  Medication Changes: No orders of the defined types were placed in this encounter.   Disposition:  Follow up 1 year Signed, Armanda Magic, MD  02/23/2019 2:04 PM    [facility]

## 2019-02-23 NOTE — Telephone Encounter (Signed)
Virtual Visit Pre-Appointment Phone Call  "(Name), I am calling you today to discuss your upcoming appointment. We are currently trying to limit exposure to the virus that causes COVID-19 by seeing patients at home rather than in the office."  1. "What is the BEST phone number to call the day of the visit?" - include this in appointment notes  2. "Do you have or have access to (through a family member/friend) a smartphone with video capability that we can use for your visit?" a. If yes - list this number in appt notes as "cell" (if different from BEST phone #) and list the appointment type as a VIDEO visit in appointment notes b. If no - list the appointment type as a PHONE visit in appointment notes  3. Confirm consent - "In the setting of the current Covid19 crisis, you are scheduled for a (phone or video) visit with your provider on (date) at (time).  Just as we do with many in-office visits, in order for you to participate in this visit, we must obtain consent.  If you'd like, I can send this to your mychart (if signed up) or email for you to review.  Otherwise, I can obtain your verbal consent now.  All virtual visits are billed to your insurance company just like a normal visit would be.  By agreeing to a virtual visit, we'd like you to understand that the technology does not allow for your provider to perform an examination, and thus may limit your provider's ability to fully assess your condition. If your provider identifies any concerns that need to be evaluated in person, we will make arrangements to do so.  Finally, though the technology is pretty good, we cannot assure that it will always work on either your or our end, and in the setting of a video visit, we may have to convert it to a phone-only visit.  In either situation, we cannot ensure that we have a secure connection.  Are you willing to proceed?" STAFF: Did the patient verbally acknowledge consent to telehealth visit? Document  YES/NO here: YES  4. Advise patient to be prepared - "Two hours prior to your appointment, go ahead and check your blood pressure, pulse, oxygen saturation, and your weight (if you have the equipment to check those) and write them all down. When your visit starts, your provider will ask you for this information. If you have an Apple Watch or Kardia device, please plan to have heart rate information ready on the day of your appointment. Please have a pen and paper handy nearby the day of the visit as well."  5. Give patient instructions for MyChart download to smartphone OR Doximity/Doxy.me as below if video visit (depending on what platform provider is using)  6. Inform patient they will receive a phone call 15 minutes prior to their appointment time (may be from unknown caller ID) so they should be prepared to answer    TELEPHONE CALL NOTE  Allen Whitney has been deemed a candidate for a follow-up tele-health visit to limit community exposure during the Covid-19 pandemic. I spoke with the patient via phone to ensure availability of phone/video source, confirm preferred email & phone number, and discuss instructions and expectations.  I reminded Allen Whitney to be prepared with any vital sign and/or heart rhythm information that could potentially be obtained via home monitoring, at the time of his visit. I reminded Allen Whitney to expect a phone call prior to his visit.  Allen Whitney, CMA 02/23/2019 1:44 PM   INSTRUCTIONS FOR DOWNLOADING THE MYCHART APP TO SMARTPHONE  - The patient must first make sure to have activated MyChart and know their login information - If Apple, go to [employer] and type in MyChart in the search bar and download the app. If Android, ask patient to go to [employer] and type in [place] in the search bar and download the app. The app is free but as with any other app downloads, their phone may require them to verify saved payment information or Apple/Android  password.  - The patient will need to then log into the app with their MyChart username and password, and select [place] as their healthcare provider to link the account. When it is time for your visit, go to the MyChart app, find appointments, and click Begin Video Visit. Be sure to Select Allow for your device to access the Microphone and Camera for your visit. You will then be connected, and your provider will be with you shortly.  **If they have any issues connecting, or need assistance please contact MyChart service desk [phone])**  **If using a computer, in order to ensure the best quality for their visit they will need to use either of the following Internet Browsers: [employer] or Google Chrome**  IF USING DOXIMITY or DOXY.ME - The patient will receive a link just prior to their visit by text.     FULL LENGTH CONSENT FOR TELE-HEALTH VISIT   I hereby voluntarily request, consent and authorize [facility] and its employed or contracted physicians, physician assistants, nurse practitioners or other licensed health care professionals (the Practitioner), to provide me with telemedicine health care services (the "Services") as deemed necessary by the treating Practitioner. I acknowledge and consent to receive the Services by the Practitioner via telemedicine. I understand that the telemedicine visit will involve communicating with the Practitioner through live audiovisual communication technology and the disclosure of certain medical information by electronic transmission. I acknowledge that I have been given the opportunity to request an in-person assessment or other available alternative prior to the telemedicine visit and am voluntarily participating in the telemedicine visit.  I understand that I have the right to withhold or withdraw my consent to the use of telemedicine in the course of my care at any time, without affecting my right to future care or treatment,  and that the Practitioner or I may terminate the telemedicine visit at any time. I understand that I have the right to inspect all information obtained and/or recorded in the course of the telemedicine visit and may receive copies of available information for a reasonable fee.  I understand that some of the potential risks of receiving the Services via telemedicine include:  Marland Kitchen Delay or interruption in medical evaluation due to technological equipment failure or disruption; . Information transmitted may not be sufficient (e.g. poor resolution of images) to allow for appropriate medical decision making by the Practitioner; and/or  . In rare instances, security protocols could fail, causing a breach of personal health information.  Furthermore, I acknowledge that it is my responsibility to provide information about my medical history, conditions and care that is complete and accurate to the best of my ability. I acknowledge that Practitioner's advice, recommendations, and/or decision may be based on factors not within their control, such as incomplete or inaccurate data provided by me or distortions of diagnostic images or specimens that may result from electronic transmissions. I understand that the  practice of medicine is not an Chief Strategy Officer and that Practitioner makes no warranties or guarantees regarding treatment outcomes. I acknowledge that I will receive a copy of this consent concurrently upon execution via email to the email address I last provided but may also request a printed copy by calling the office of [place].    I understand that my insurance will be billed for this visit.   I have read or had this consent read to me. . I understand the contents of this consent, which adequately explains the benefits and risks of the Services being provided via telemedicine.  . I have been provided ample opportunity to ask questions regarding this consent and the Services and have had my questions  answered to my satisfaction. . I give my informed consent for the services to be provided through the use of telemedicine in my medical care  By participating in this telemedicine visit I agree to the above.

## 2019-02-23 NOTE — Telephone Encounter (Signed)
Order placed to [employer] via community message.  Working on Nurse, adult

## 2019-02-27 NOTE — Telephone Encounter (Signed)
Download printed and sent to be scanned 

## 2019-03-01 ENCOUNTER — Telehealth: Payer: Self-pay | Admitting: *Deleted

## 2019-03-01 NOTE — Telephone Encounter (Signed)
Informed patient of compliance results and verbalized understanding was indicated. Patient is aware and agreeable to AHI being within range at 0.4. Patient is aware and agreeable to being in compliance with machine usage. Patient is aware and agreeable to no change in current pressures. 

## 2019-03-01 NOTE — Telephone Encounter (Signed)
-----   Message from Quintella Reichert, MD sent at 02/27/2019  8:57 PM EDT ----- Good AHI and compliance.  Continue current PAP settings.

## 2019-05-07 ENCOUNTER — Other Ambulatory Visit: Payer: Self-pay | Admitting: Cardiology

## 2019-07-11 DIAGNOSIS — H353211 Exudative age-related macular degeneration, right eye, with active choroidal neovascularization: Secondary | ICD-10-CM | POA: Diagnosis not present

## 2019-08-04 ENCOUNTER — Other Ambulatory Visit: Payer: Self-pay | Admitting: Cardiology

## 2019-08-04 DIAGNOSIS — M179 Osteoarthritis of knee, unspecified: Secondary | ICD-10-CM | POA: Diagnosis not present

## 2019-08-04 DIAGNOSIS — E7800 Pure hypercholesterolemia, unspecified: Secondary | ICD-10-CM | POA: Diagnosis not present

## 2019-08-04 DIAGNOSIS — I10 Essential (primary) hypertension: Secondary | ICD-10-CM | POA: Diagnosis not present

## 2019-08-04 DIAGNOSIS — Z125 Encounter for screening for malignant neoplasm of prostate: Secondary | ICD-10-CM | POA: Diagnosis not present

## 2019-08-14 DIAGNOSIS — Z20828 Contact with and (suspected) exposure to other viral communicable diseases: Secondary | ICD-10-CM | POA: Diagnosis not present

## 2019-09-01 DIAGNOSIS — L578 Other skin changes due to chronic exposure to nonionizing radiation: Secondary | ICD-10-CM | POA: Diagnosis not present

## 2019-09-01 DIAGNOSIS — L570 Actinic keratosis: Secondary | ICD-10-CM | POA: Diagnosis not present

## 2019-09-01 DIAGNOSIS — D2239 Melanocytic nevi of other parts of face: Secondary | ICD-10-CM | POA: Diagnosis not present

## 2019-09-05 DIAGNOSIS — Z8739 Personal history of other diseases of the musculoskeletal system and connective tissue: Secondary | ICD-10-CM | POA: Diagnosis not present

## 2019-09-05 DIAGNOSIS — E7800 Pure hypercholesterolemia, unspecified: Secondary | ICD-10-CM | POA: Diagnosis not present

## 2019-09-05 DIAGNOSIS — I10 Essential (primary) hypertension: Secondary | ICD-10-CM | POA: Diagnosis not present

## 2019-09-05 DIAGNOSIS — Z125 Encounter for screening for malignant neoplasm of prostate: Secondary | ICD-10-CM | POA: Diagnosis not present

## 2019-11-03 DIAGNOSIS — J3489 Other specified disorders of nose and nasal sinuses: Secondary | ICD-10-CM | POA: Diagnosis not present

## 2019-11-03 DIAGNOSIS — Z20828 Contact with and (suspected) exposure to other viral communicable diseases: Secondary | ICD-10-CM | POA: Diagnosis not present

## 2020-01-22 DIAGNOSIS — I10 Essential (primary) hypertension: Secondary | ICD-10-CM | POA: Diagnosis not present

## 2020-01-22 DIAGNOSIS — Z13220 Encounter for screening for lipoid disorders: Secondary | ICD-10-CM | POA: Diagnosis not present

## 2020-01-22 DIAGNOSIS — Z125 Encounter for screening for malignant neoplasm of prostate: Secondary | ICD-10-CM | POA: Diagnosis not present

## 2020-01-22 DIAGNOSIS — Z0000 Encounter for general adult medical examination without abnormal findings: Secondary | ICD-10-CM | POA: Diagnosis not present

## 2020-03-04 NOTE — Progress Notes (Signed)
Cardiology Office Note:    Date:  03/05/2020   ID:  Allen Whitney, DOB [birth year], MRN [id number]  PCP:  Lawerance Cruel, MD  Cardiologist:  Fransico Him, MD    Referring MD: Lawerance Cruel, MD   Chief Complaint  Patient presents with  . Sleep Apnea  . Hypertension    History of Present Illness:    Allen Whitney is a 59 y.o. male with a hx of Allen Robersonis a 59 y.o.malewith a hx of OSA on CPAP.He is doing well with his CPAP device and thinks that he has gotten used to it.  He tolerates the mask and feels the pressure is adequate.  Since going on CPAP he feels rested in the am and has no significant daytime sleepiness.  He denies any significant mouth or nasal dryness or nasal congestion.  He does not think that he snores.  His device now reading end of motor life.     Past Medical History:  Diagnosis Date  . Diverticulitis   . Gout attack 07/07/2013  . Hypertension 01/10/2018  . PE (pulmonary embolism)    left lung  . Renal insufficiency, mild 07/07/2013  . Sleep apnea    uses CPAP at night    Past Surgical History:  Procedure Laterality Date  . CHOLECYSTECTOMY N/A 07/01/2013   Procedure: LAPAROSCOPIC CHOLECYSTECTOMY;  Surgeon: Odis Hollingshead, MD;  Location: [facility];  Service: General;  Laterality: N/A;  . TONSILLECTOMY    . UMBILICAL HERNIA REPAIR N/A 07/01/2013   Procedure: HERNIA REPAIR UMBILICAL ADULT;  Surgeon: Odis Hollingshead, MD;  Location: [facility];  Service: General;  Laterality: N/A;    Current Medications: Current Meds  Medication Sig  . amLODipine (NORVASC) 5 MG tablet Take 0.5 tablets (2.5 mg total) by mouth daily.  . metoprolol tartrate (LOPRESSOR) 25 MG tablet TAKE 1 TABLET BY MOUTH TWICE A DAY  . Multiple Vitamins-Minerals (OCUVITE EYE HEALTH FORMULA PO) Take 1 tablet by mouth daily.  . naproxen sodium (ALEVE) 220 MG tablet Take 220 mg by mouth daily as needed (Knee pain).     Allergies:   Patient has no known allergies.   Social  History   Socioeconomic History  . Marital status: Married    Spouse name: Not on file  . Number of children: Not on file  . Years of education: Not on file  . Highest education level: Not on file  Occupational History  . Not on file  Tobacco Use  . Smoking status: Never Smoker  . Smokeless tobacco: Never Used  Substance and Sexual Activity  . Alcohol use: No  . Drug use: No  . Sexual activity: Yes  Other Topics Concern  . Not on file  Social History Narrative  . Not on file   Social Determinants of Health   Financial Resource Strain:   . Difficulty of Paying Living Expenses:   Food Insecurity:   . Worried About Charity fundraiser in the Last Year:   . Arboriculturist in the Last Year:   Transportation Needs:   . Film/video editor (Medical):   Marland Kitchen Lack of Transportation (Non-Medical):   Physical Activity:   . Days of Exercise per Week:   . Minutes of Exercise per Session:   Stress:   . Feeling of Stress :   Social Connections:   . Frequency of Communication with Friends and Family:   . Frequency of Social Gatherings with Friends and Family:   . Attends  Religious Services:   . Active Member of Clubs or Organizations:   . Attends Banker Meetings:   Marland Kitchen Marital Status:      Family History: The patient's family history includes Cataracts in his father; Heart attack in his mother; Macular degeneration in his mother.  ROS:   Please see the history of present illness.    ROS  All other systems reviewed and negative.   EKGs/Labs/Other Studies Reviewed:    The following studies were reviewed today: PAP compliance download  EKG:  EKG is  ordered today and showed NSR with no ST changes  Recent Labs: No results found for requested labs within last 8760 hours.   Recent Lipid Panel No results found for: CHOL, TRIG, HDL, CHOLHDL, VLDL, LDLCALC, LDLDIRECT  Physical Exam:    VS:  BP 136/84   Pulse (!) 58   Ht 5' 10.5" (1.791 m)   Wt 235 lb 12.8  oz (107 kg)   BMI 33.36 kg/m     Wt Readings from Last 3 Encounters:  03/05/20 235 lb 12.8 oz (107 kg)  02/23/19 225 lb (102.1 kg)  11/17/17 222 lb 9.6 oz (101 kg)     GEN:  Well nourished, well developed in no acute distress HEENT: Normal NECK: No JVD; No carotid bruits LYMPHATICS: No lymphadenopathy CARDIAC: RRR, no murmurs, rubs, gallops RESPIRATORY:  Clear to auscultation without rales, wheezing or rhonchi  ABDOMEN: Soft, non-tender, non-distended MUSCULOSKELETAL:  No edema; No deformity  SKIN: Warm and dry NEUROLOGIC:  Alert and oriented x 3 PSYCHIATRIC:  Normal affect   ASSESSMENT:    1. OSA on CPAP   2. Essential hypertension   3. Obesity (BMI 30-39.9)    PLAN:    In order of problems listed above:  1.  OSA -   The patient is tolerating PAP therapy well without any problems.   The patient has been using and benefiting from PAP use and will continue to benefit from therapy.  Unable to get any info off of his download chip -his device is now at end of life for motor life so I will get him a new device -I will order him a new ResMed CPAP on 14cm H2O with heated humidity and mask of choice.  2.  HTN -BP controlled on exam -continue amlodipine 2.5mg  daily and Lopressor 25mg  BID  3.  Obesity -I have encouraged him cut back on carbs and portions.  -encouraged him to try to exercise with swimming since he has bad knees  Medication Adjustments/Labs and Tests Ordered: Current medicines are reviewed at length with the patient today.  Concerns regarding medicines are outlined above.  No orders of the defined types were placed in this encounter.  No orders of the defined types were placed in this encounter.   Signed, , MD  03/05/2020 8:17 AM    [facility]

## 2020-03-05 ENCOUNTER — Ambulatory Visit: Payer: BC Managed Care – PPO | Admitting: Cardiology

## 2020-03-05 ENCOUNTER — Encounter: Payer: Self-pay | Admitting: Cardiology

## 2020-03-05 ENCOUNTER — Other Ambulatory Visit: Payer: Self-pay

## 2020-03-05 ENCOUNTER — Telehealth: Payer: Self-pay | Admitting: *Deleted

## 2020-03-05 VITALS — BP 136/84 | HR 58 | Ht 70.5 in | Wt 235.8 lb

## 2020-03-05 DIAGNOSIS — E669 Obesity, unspecified: Secondary | ICD-10-CM

## 2020-03-05 DIAGNOSIS — Z9989 Dependence on other enabling machines and devices: Secondary | ICD-10-CM

## 2020-03-05 DIAGNOSIS — G4733 Obstructive sleep apnea (adult) (pediatric): Secondary | ICD-10-CM | POA: Diagnosis not present

## 2020-03-05 DIAGNOSIS — I10 Essential (primary) hypertension: Secondary | ICD-10-CM

## 2020-03-05 NOTE — Patient Instructions (Addendum)
Medication Instructions:  Your physician recommends that you continue on your current medications as directed. Please refer to the Current Medication list given to you today.  *If you need a refill on your cardiac medications before your next appointment, please call your pharmacy*   Follow-Up: At [facility], you and your health needs are our priority.  As part of our continuing mission to provide you with exceptional heart care, we have created designated Provider Care Teams.  These Care Teams include your primary Cardiologist (physician) and Advanced Practice Providers (APPs -  Physician Assistants and Nurse Practitioners) who all work together to provide you with the care you need, when you need it.  Your next appointment:   8 weeks  The format for your next appointment:   Virtual   Provider:   Armanda Magic, MD

## 2020-03-05 NOTE — Telephone Encounter (Signed)
-----   Message from Theresia Majors, RN sent at 03/05/2020  8:31 AM EDT ----- Per Dr. Mayford Knife order Resmed CPAP at 14 cm H2O with heated humidity and mask of choice.  Follow-up 8 weeks after new device.

## 2020-03-05 NOTE — Telephone Encounter (Signed)
Order placed to [employer] [facility] via community message. 

## 2020-03-14 DIAGNOSIS — G4733 Obstructive sleep apnea (adult) (pediatric): Secondary | ICD-10-CM | POA: Diagnosis not present

## 2020-03-15 DIAGNOSIS — G4733 Obstructive sleep apnea (adult) (pediatric): Secondary | ICD-10-CM | POA: Diagnosis not present

## 2020-03-18 NOTE — Telephone Encounter (Addendum)
Patient has a 10 week follow up appointment scheduled for 05/21/20. Patient understands he needs to keep this appointment for insurance compliance. Patient was grateful for the call and thanked me.  

## 2020-04-14 DIAGNOSIS — G4733 Obstructive sleep apnea (adult) (pediatric): Secondary | ICD-10-CM | POA: Diagnosis not present

## 2020-04-18 ENCOUNTER — Other Ambulatory Visit: Payer: Self-pay

## 2020-04-18 ENCOUNTER — Telehealth: Payer: Self-pay | Admitting: *Deleted

## 2020-04-18 ENCOUNTER — Telehealth (INDEPENDENT_AMBULATORY_CARE_PROVIDER_SITE_OTHER): Payer: BC Managed Care – PPO | Admitting: Cardiology

## 2020-04-18 ENCOUNTER — Encounter: Payer: Self-pay | Admitting: Cardiology

## 2020-04-18 VITALS — BP 148/82 | HR 72 | Temp 97.6°F | Ht 70.5 in | Wt 223.0 lb

## 2020-04-18 DIAGNOSIS — G4733 Obstructive sleep apnea (adult) (pediatric): Secondary | ICD-10-CM

## 2020-04-18 DIAGNOSIS — E669 Obesity, unspecified: Secondary | ICD-10-CM

## 2020-04-18 DIAGNOSIS — Z9989 Dependence on other enabling machines and devices: Secondary | ICD-10-CM

## 2020-04-18 DIAGNOSIS — I493 Ventricular premature depolarization: Secondary | ICD-10-CM | POA: Diagnosis not present

## 2020-04-18 DIAGNOSIS — I10 Essential (primary) hypertension: Secondary | ICD-10-CM

## 2020-04-18 NOTE — Progress Notes (Signed)
Virtual Visit via Telephone Note   This visit type was conducted due to national recommendations for restrictions regarding the COVID-19 Pandemic (e.g. social distancing) in an effort to limit this patient's exposure and mitigate transmission in our community.  Due to his co-morbid illnesses, this patient is at least at moderate risk for complications without adequate follow up.  This format is felt to be most appropriate for this patient at this time.  The patient did not have access to video technology/had technical difficulties with video requiring transitioning to audio format only (telephone).  All issues noted in this document were discussed and addressed.  No physical exam could be performed with this format.  Please refer to the patient's chart for his  consent to telehealth for [facility]  Evaluation Performed:  Follow-up visit  This visit type was conducted due to national recommendations for restrictions regarding the COVID-19 Pandemic (e.g. social distancing).  This format is felt to be most appropriate for this patient at this time.  All issues noted in this document were discussed and addressed.  No physical exam was performed (except for noted visual exam findings with Video Visits).  Please refer to the patient's chart (MyChart message for video visits and phone note for telephone visits) for the patient's consent to telehealth for [facility].  Date:  04/18/2020   ID:  Allen Whitney, DOB 1961-01-19, MRN [id number]  Patient Location:  Home  Provider location:   [place]  PCP:  Lawerance Cruel, MD  Sleep Medicine:  Fransico Him, MD Electrophysiologist:  None   Chief Complaint:  OSA  History of Present Illness:    Allen Whitney is a 59 y.o. male who presents via audio/video conferencing for a telehealth visit today.    Allen Whitney is a 59 y.o. male with a hx of Allen Whitney.  I saw him back in May and his  PAP device was end of battery life and we ordered a new device.  He is now here for followup per insurance requirements to document compliance with his new device.    He is doing well with his Whitney device and thinks that he has gotten used to it.  He tolerates the mask and feels the pressure is adequate.  Since going on Whitney he feels rested in the am and has no significant daytime sleepiness.  He denies any significant mouth or nasal dryness or nasal congestion.  He does not think that he snores.     The patient does not have symptoms concerning for COVID-19 infection (fever, chills, cough, or new shortness of breath).    Prior CV studies:   The following studies were reviewed today:  PAP compliance download  Past Medical History:  Diagnosis Date  . Diverticulitis   . Gout attack 07/07/2013  . Hypertension 01/10/2018  . PE (pulmonary embolism)    left lung  . Renal insufficiency, mild 07/07/2013  . Sleep apnea    uses Whitney at night   Past Surgical History:  Procedure Laterality Date  . CHOLECYSTECTOMY N/A 07/01/2013   Procedure: LAPAROSCOPIC CHOLECYSTECTOMY;  Surgeon: Odis Hollingshead, MD;  Location: [facility];  Service: General;  Laterality: N/A;  . TONSILLECTOMY    . UMBILICAL HERNIA REPAIR N/A 07/01/2013   Procedure: HERNIA REPAIR UMBILICAL ADULT;  Surgeon: Odis Hollingshead, MD;  Location: [facility];  Service: General;  Laterality: N/A;     Current Meds  Medication Sig  . amLODipine (  NORVASC) 5 MG tablet Take 0.5 tablets (2.5 mg total) by mouth daily.  . metoprolol tartrate (LOPRESSOR) 25 MG tablet TAKE 1 TABLET BY MOUTH TWICE A DAY  . Multiple Vitamins-Minerals (OCUVITE EYE HEALTH FORMULA PO) Take 1 tablet by mouth daily.  . naproxen sodium (ALEVE) 220 MG tablet Take 220 mg by mouth daily as needed (Knee pain).     Allergies:   Patient has no known allergies.   Social History   Tobacco Use  . Smoking status: Never Smoker  . Smokeless tobacco: Never Used  Substance Use Topics    . Alcohol use: No  . Drug use: No     Family Hx: The patient's family history includes Cataracts in his father; Heart attack in his mother; Macular degeneration in his mother.  ROS:   Please see the history of present illness.     All other systems reviewed and are negative.   Labs/Other Tests and Data Reviewed:    Recent Labs: No results found for requested labs within last 8760 hours.   Recent Lipid Panel No results found for: CHOL, TRIG, HDL, CHOLHDL, LDLCALC, LDLDIRECT  Wt Readings from Last 3 Encounters:  04/18/20 223 lb (101.2 kg)  03/05/20 235 lb 12.8 oz (107 kg)  02/23/19 225 lb (102.1 kg)     Objective:    Vital Signs:  BP (!) 148/82   Pulse 72   Temp 97.6 F (36.4 C)   Ht 5' 10.5" (1.791 m)   Wt 223 lb (101.2 kg)   BMI 31.54 kg/m     ASSESSMENT & PLAN:    1.  OSA -  The patient is tolerating PAP therapy well without any problems. The PAP download was reviewed today and showed an AHI of 0.9/hr on 14 cm H2O with 100% compliance in using more than 4 hours nightly.  The patient has been using and benefiting from PAP use and will continue to benefit from therapy.  -I will order him a new headgear with the tubing coming out at the top of his head to see if that allows the mask to not move as much  2.  HTN -BP borderline elevated -continue amlodipine 2.5mg  daily and Lopressor 25mg  BID -check BP daily for a week and call  3.  Obesity -I have encouraged him to get into a routine exercise program and cut back on carbs and portions.   4.  Palpitations/PVCs/NSVT/PACs -event monitor in 2019 showed PVCs and PACs and NSVT -cardiac MRI and ETT were normal -he once and a while will have palpitations if he is late in taking his BB -continue BB and make sure not to miss doses -he will let me know if his palpitations increase  COVID-19 Education: The signs and symptoms of COVID-19 were discussed with the patient and how to seek care for testing (follow up with PCP  or arrange E-visit).  The importance of social distancing was discussed today.  Patient Risk:   After full review of this patient's clinical status, I feel that they are at least moderate risk at this time.  Time:   Today, I have spent 20 minutes on telemedicine discussing medical problems including OSA< HTN, Obesity and reviewing patient's chart including PAP compliance download.  Medication Adjustments/Labs and Tests Ordered: Current medicines are reviewed at length with the patient today.  Concerns regarding medicines are outlined above.  Tests Ordered: No orders of the defined types were placed in this encounter.  Medication Changes: No orders of the defined  types were placed in this encounter.   Disposition:  Follow up in 1 year(s)  Signed, Armanda Magic, MD  04/18/2020 9:38 AM    [facility]

## 2020-04-18 NOTE — Telephone Encounter (Signed)
Order placed to [facility] via community message for a new mask with the tubing coming off at the top of his head.

## 2020-04-18 NOTE — Telephone Encounter (Signed)
-----   Message from Quintella Reichert, MD sent at 04/18/2020  9:39 AM EDT ----- Please call DME to ask if they can get him a new mask with the tubing coming off at the top of his head.  Followup with me in 1 year

## 2020-05-01 ENCOUNTER — Telehealth: Payer: Self-pay

## 2020-05-01 MED ORDER — AMLODIPINE BESYLATE 5 MG PO TABS: 5.0000 mg | ORAL_TABLET | Freq: Every day | ORAL | 3 refills | Status: DC

## 2020-05-01 NOTE — Telephone Encounter (Signed)
-----   Message from Quintella Reichert, MD sent at 05/01/2020  9:10 AM EDT ----- BPS are still borderline elevated - increase amlodipine to 5mg  daily and check BP daily for a week and call  Traci ----- Message ----- From: , RN Sent: 05/01/2020   8:16 AM EDT To: 07/02/2020, MD  BP readings attached. Patient is taking amlodipine 2.5 daily and Lopressor 25 mg BID

## 2020-05-01 NOTE — Telephone Encounter (Signed)
Spoke with the patient and will increase amlodipine to 5 mg daily and continue to take BP daily for another week.

## 2020-05-14 DIAGNOSIS — G4733 Obstructive sleep apnea (adult) (pediatric): Secondary | ICD-10-CM | POA: Diagnosis not present

## 2020-06-14 DIAGNOSIS — G4733 Obstructive sleep apnea (adult) (pediatric): Secondary | ICD-10-CM | POA: Diagnosis not present

## 2020-07-02 ENCOUNTER — Other Ambulatory Visit: Payer: Self-pay

## 2020-07-02 MED ORDER — METOPROLOL TARTRATE 25 MG PO TABS: 25.0000 mg | ORAL_TABLET | Freq: Two times a day (BID) | ORAL | 2 refills | Status: DC

## 2020-07-02 MED ORDER — AMLODIPINE BESYLATE 5 MG PO TABS: 5.0000 mg | ORAL_TABLET | Freq: Every day | ORAL | 2 refills | Status: DC

## 2020-07-02 NOTE — Telephone Encounter (Signed)
Pt's medication was sent to pt's pharmacy as requested. Confirmation received.  °

## 2020-07-04 MED ORDER — AMLODIPINE BESYLATE 5 MG PO TABS: 7.5000 mg | ORAL_TABLET | Freq: Every day | ORAL | 3 refills | Status: DC

## 2020-07-19 DIAGNOSIS — G4733 Obstructive sleep apnea (adult) (pediatric): Secondary | ICD-10-CM | POA: Diagnosis not present

## 2020-08-29 DIAGNOSIS — H353211 Exudative age-related macular degeneration, right eye, with active choroidal neovascularization: Secondary | ICD-10-CM | POA: Diagnosis not present

## 2020-09-06 DIAGNOSIS — L578 Other skin changes due to chronic exposure to nonionizing radiation: Secondary | ICD-10-CM | POA: Diagnosis not present

## 2020-09-06 DIAGNOSIS — L821 Other seborrheic keratosis: Secondary | ICD-10-CM | POA: Diagnosis not present

## 2020-09-06 DIAGNOSIS — L570 Actinic keratosis: Secondary | ICD-10-CM | POA: Diagnosis not present

## 2020-10-31 DIAGNOSIS — H35371 Puckering of macula, right eye: Secondary | ICD-10-CM | POA: Diagnosis not present

## 2020-11-01 DIAGNOSIS — H353212 Exudative age-related macular degeneration, right eye, with inactive choroidal neovascularization: Secondary | ICD-10-CM | POA: Diagnosis not present

## 2020-11-01 DIAGNOSIS — H3582 Retinal ischemia: Secondary | ICD-10-CM

## 2020-11-01 MED ORDER — AMLODIPINE BESYLATE 5 MG PO TABS: 7.5000 mg | ORAL_TABLET | Freq: Every day | ORAL | 3 refills | Status: DC

## 2020-11-01 NOTE — Telephone Encounter (Signed)
Left message at [facility]

## 2020-11-01 NOTE — Telephone Encounter (Signed)
Left message for patient to call back  

## 2020-11-01 NOTE — Telephone Encounter (Signed)
Pt is returning call.  

## 2020-11-06 DIAGNOSIS — G4733 Obstructive sleep apnea (adult) (pediatric): Secondary | ICD-10-CM | POA: Diagnosis not present

## 2020-11-07 ENCOUNTER — Telehealth: Payer: Self-pay | Admitting: *Deleted

## 2020-11-07 DIAGNOSIS — G4733 Obstructive sleep apnea (adult) (pediatric): Secondary | ICD-10-CM

## 2020-11-07 DIAGNOSIS — Z9989 Dependence on other enabling machines and devices: Secondary | ICD-10-CM

## 2020-11-07 NOTE — Telephone Encounter (Signed)
Order sent to [facility] to change CPAP to auto from 4 to 18cm H2O and get a download in 2 weeks and call if sx do not resolve.

## 2020-11-08 ENCOUNTER — Other Ambulatory Visit: Payer: Self-pay

## 2020-11-08 ENCOUNTER — Ambulatory Visit (HOSPITAL_COMMUNITY)
Admission: RE | Admit: 2020-11-08 | Discharge: 2020-11-08 | Disposition: A | Discharge status: Home / Self Care | Payer: BC Managed Care – PPO | Source: Ambulatory Visit | Attending: Cardiovascular Disease | Admitting: Cardiovascular Disease

## 2020-11-08 ENCOUNTER — Other Ambulatory Visit (HOSPITAL_COMMUNITY): Payer: BC Managed Care – PPO

## 2020-11-08 ENCOUNTER — Ambulatory Visit (HOSPITAL_BASED_OUTPATIENT_CLINIC_OR_DEPARTMENT_OTHER): Payer: BC Managed Care – PPO

## 2020-11-08 DIAGNOSIS — H3582 Retinal ischemia: Secondary | ICD-10-CM

## 2020-11-08 LAB — ECHOCARDIOGRAM COMPLETE
Area-P 1/2: 3.79 cm2
S' Lateral: 2.2 cm

## 2020-11-08 MED ORDER — PERFLUTREN LIPID MICROSPHERE
1.0000 mL | INTRAVENOUS | Status: AC | PRN
  Administered 2020-11-08: 2 mL via INTRAVENOUS

## 2020-11-10 ENCOUNTER — Encounter: Payer: Self-pay | Admitting: Cardiology

## 2020-11-11 ENCOUNTER — Telehealth: Payer: Self-pay

## 2020-11-11 DIAGNOSIS — I6523 Occlusion and stenosis of bilateral carotid arteries: Secondary | ICD-10-CM

## 2020-11-11 MED ORDER — ASPIRIN EC 81 MG PO TBEC: 81.0000 mg | DELAYED_RELEASE_TABLET | Freq: Every day | ORAL | 3 refills | Status: AC

## 2020-11-11 NOTE — Telephone Encounter (Signed)
The patient has been notified of the result and verbalized understanding.  All questions (if any) were answered. Theresia Majors, RN 11/11/2020 12:27 PM

## 2020-11-11 NOTE — Telephone Encounter (Signed)
-----   Message from Quintella Reichert, MD sent at 11/10/2020 11:01 AM EST ----- 1-39% bilateral carotid stenosis - repeat dopplers in 2 years.  Please have him come in for FLP and ALT.  Start ASA 81mg  daily

## 2020-11-14 DIAGNOSIS — H3582 Retinal ischemia: Secondary | ICD-10-CM | POA: Diagnosis not present

## 2020-12-05 ENCOUNTER — Other Ambulatory Visit: Payer: Self-pay

## 2020-12-05 DIAGNOSIS — I6523 Occlusion and stenosis of bilateral carotid arteries: Secondary | ICD-10-CM | POA: Diagnosis not present

## 2020-12-05 NOTE — Telephone Encounter (Signed)
Spoke with the patient and advised him that labs were released. He is going back to [facility] now and will let me know if there is any issues.

## 2020-12-06 LAB — LIPID PANEL
Chol/HDL Ratio: 3.5 ratio (ref 0.0–5.0)
Cholesterol, Total: 211 mg/dL — ABNORMAL HIGH (ref 100–199)
HDL: 60 mg/dL (ref >39)
LDL Chol Calc (NIH): 134 mg/dL — ABNORMAL HIGH (ref 0–99)
Triglycerides: 94 mg/dL (ref 0–149)
VLDL Cholesterol Cal: 17 mg/dL (ref 5–40)

## 2020-12-06 LAB — ALT: ALT: 26 IU/L (ref 0–44)

## 2020-12-10 ENCOUNTER — Telehealth: Payer: Self-pay

## 2020-12-10 DIAGNOSIS — I6523 Occlusion and stenosis of bilateral carotid arteries: Secondary | ICD-10-CM

## 2020-12-10 MED ORDER — ATORVASTATIN CALCIUM 20 MG PO TABS: 20.0000 mg | ORAL_TABLET | Freq: Every day | ORAL | 3 refills | Status: DC

## 2020-12-10 NOTE — Telephone Encounter (Signed)
-----   Message from Quintella Reichert, MD sent at 12/07/2020  7:28 PM EST ----- LDL goal < 70 given carotid stenosis.  Start Lipitor 20mg  daily and repeat FLP and ALT in 6 weeks

## 2020-12-10 NOTE — Telephone Encounter (Signed)
The patient has been notified of the result and verbalized understanding.  All questions (if any) were answered. Theresia Majors, RN 12/10/2020 12:05 PM  Patient will start lipitor 20 mg daily and repeat labs in 6 weeks.

## 2021-01-29 ENCOUNTER — Other Ambulatory Visit: Payer: Self-pay

## 2021-01-29 DIAGNOSIS — I6523 Occlusion and stenosis of bilateral carotid arteries: Secondary | ICD-10-CM

## 2021-02-03 DIAGNOSIS — Z125 Encounter for screening for malignant neoplasm of prostate: Secondary | ICD-10-CM | POA: Diagnosis not present

## 2021-02-03 DIAGNOSIS — Z8739 Personal history of other diseases of the musculoskeletal system and connective tissue: Secondary | ICD-10-CM | POA: Diagnosis not present

## 2021-02-03 DIAGNOSIS — M179 Osteoarthritis of knee, unspecified: Secondary | ICD-10-CM | POA: Diagnosis not present

## 2021-02-03 DIAGNOSIS — Z23 Encounter for immunization: Secondary | ICD-10-CM | POA: Diagnosis not present

## 2021-02-03 DIAGNOSIS — Z0000 Encounter for general adult medical examination without abnormal findings: Secondary | ICD-10-CM | POA: Diagnosis not present

## 2021-02-04 DIAGNOSIS — G4733 Obstructive sleep apnea (adult) (pediatric): Secondary | ICD-10-CM | POA: Diagnosis not present

## 2021-02-11 DIAGNOSIS — H353212 Exudative age-related macular degeneration, right eye, with inactive choroidal neovascularization: Secondary | ICD-10-CM | POA: Diagnosis not present

## 2021-02-20 DIAGNOSIS — M25562 Pain in left knee: Secondary | ICD-10-CM | POA: Diagnosis not present

## 2021-03-26 DIAGNOSIS — M1712 Unilateral primary osteoarthritis, left knee: Secondary | ICD-10-CM | POA: Diagnosis not present

## 2021-03-28 ENCOUNTER — Other Ambulatory Visit: Payer: Self-pay | Admitting: Cardiology

## 2021-03-28 NOTE — Telephone Encounter (Signed)
Pt's pharmacy is requesting a refill on metoprolol. Dr. Mayford Knife sees pt for sleep. Will Dr. Mayford Knife like to refill this medication? Please address

## 2021-04-28 NOTE — Progress Notes (Signed)
Date:  04/29/2021   ID:  [name], DOB 04-20-1961, MRN [id number]  PCP:  Daisy Floro, MD  Sleep Medicine:  Armanda Magic, MD Electrophysiologist:  None   Chief Complaint: OSA, HTN, Obesity, PACs, NSVT, PVCs  History of Present Illness:    Allen Whitney is a 60 y.o. male  with a hx of OSA on CPAP.  He is doing well with his CPAP device and thinks that he has gotten used to it.  He tolerates the mask and feels the pressure is adequate.  Since going on CPAP he still feels sleepy in the am but gets up at least 3 times nightly to use the restroom.  He denies any significant mouth or nasal dryness or nasal congestion.  He does not think that he snores.     He is here today for followup and is doing well.  He says that he occasionally has some mild pressure in his chest with extreme exertion with no associated sx and no radiation.  He says that it feels more like being out of shape. This is nothing different from what he had in 2019 when he had his ETT done that was normal.   He occasionally has some DOE with extreme exertion and says he is out of shape. He denies any PND, orthopnea, LE edema, dizziness or syncope. Occasionally he will notice a flip flop in his heart.  He feels like nothing has changed in the last 15 years. He is compliant with his meds and is tolerating meds with no SE.     Prior CV studies:   The following studies were reviewed today:  PAP compliance download  Past Medical History:  Diagnosis Date   Bilateral carotid artery disease (HCC)    1-39% bilateral by dopplers 10/2020   Diverticulitis    Gout attack 07/07/2013   Hypertension 01/10/2018   PE (pulmonary embolism)    left lung   Renal insufficiency, mild 07/07/2013   Sleep apnea    uses CPAP at night   Past Surgical History:  Procedure Laterality Date   CHOLECYSTECTOMY N/A 07/01/2013   Procedure: LAPAROSCOPIC CHOLECYSTECTOMY;  Surgeon: Adolph Pollack, MD;  Location: [facility];  Service: General;   Laterality: N/A;   TONSILLECTOMY     UMBILICAL HERNIA REPAIR N/A 07/01/2013   Procedure: HERNIA REPAIR UMBILICAL ADULT;  Surgeon: Adolph Pollack, MD;  Location: [facility];  Service: General;  Laterality: N/A;     Current Meds  Medication Sig   acetaminophen (TYLENOL) 500 MG tablet Take 500 mg by mouth every 6 (six) hours as needed for mild pain or moderate pain (KNEE PAIN).   amLODipine (NORVASC) 5 MG tablet Take 1.5 tablets (7.5 mg total) by mouth daily.   aspirin EC 81 MG tablet Take 1 tablet (81 mg total) by mouth daily. Swallow whole.   atorvastatin (LIPITOR) 20 MG tablet Take 1 tablet (20 mg total) by mouth daily.   metoprolol tartrate (LOPRESSOR) 25 MG tablet TAKE 1 TABLET BY MOUTH TWICE A DAY   Multiple Vitamins-Minerals (OCUVITE EYE HEALTH FORMULA PO) Take 1 tablet by mouth daily.     Allergies:   Patient has no known allergies.   Social History   Tobacco Use   Smoking status: Never   Smokeless tobacco: Never  Substance Use Topics   Alcohol use: No   Drug use: No     Family Hx: The patient's family history includes Cataracts in his father; Heart attack in his mother;  Macular degeneration in his mother.  ROS:   Please see the history of present illness.     All other systems reviewed and are negative.   Labs/Other Tests and Data Reviewed:    Recent Labs: 12/05/2020: ALT 26   Recent Lipid Panel Lab Results  Component Value Date/Time   CHOL 211 (H) 12/05/2020 02:22 PM   TRIG 94 12/05/2020 02:22 PM   HDL 60 12/05/2020 02:22 PM   CHOLHDL 3.5 12/05/2020 02:22 PM   LDLCALC 134 (H) 12/05/2020 02:22 PM    Wt Readings from Last 3 Encounters:  04/29/21 207 lb 6.4 oz (94.1 kg)  04/18/20 223 lb (101.2 kg)  03/05/20 235 lb 12.8 oz (107 kg)     Objective:    Vital Signs:  BP 122/80   Pulse 60   Ht 5\' 10"  (1.778 m)   Wt 207 lb 6.4 oz (94.1 kg)   SpO2 98%   BMI 29.76 kg/m    GEN: Well nourished, well developed in no acute distress HEENT: Normal NECK: No JVD;  No carotid bruits LYMPHATICS: No lymphadenopathy CARDIAC:RRR, no murmurs, rubs, gallops RESPIRATORY:  Clear to auscultation without rales, wheezing or rhonchi  ABDOMEN: Soft, non-tender, non-distended MUSCULOSKELETAL:  No edema; No deformity  SKIN: Warm and dry NEUROLOGIC:  Alert and oriented x 3 PSYCHIATRIC:  Normal affect    EKG was performed in the office today and showed NSR with no ST changes ASSESSMENT & PLAN:    1.  OSA - The patient is tolerating PAP therapy well without any problems. The PAP download performed by his DME was personally reviewed and interpreted by me today and showed an AHI of 0.7/hr on auto CPAP with 100% compliance in using more than 4 hours nightly.  The patient has been using and benefiting from PAP use and will continue to benefit from therapy.    2.  HTN -BP is controlled on exam today -Continue prescription drug management with Amlodipine 2.5mg  daily >>refilled for 1 year -I am going to change his Lopressor to Bystolic 5mg  daily since he is complaining of daytime fatigue  3.  Obesity -I have encouraged him to get into a routine exercise program and cut back on carbs and portions.    4.  Palpitations/PVCs/NSVT/PACs -event monitor in 2019 showed PVCs and PACs and NSVT -cardiac MRI and ETT were normal -His palpitations are well controlled on BB -Change Lopressor to Bystolic 5mg  daily due to fatigue and sleepiness of Lopressor  5.  Chest pressure -this has not changed in the past 15 years and had an ETT in 2019 that showed no ischemia -This only occurs with extreme exertion -will get a coronary Ca score to assess risk -his sx have not changed since his ETT in 2019>>will repeat ETT -Shared Decision Making/Informed Consent The risks [chest pain, shortness of breath, cardiac arrhythmias, dizziness, blood pressure fluctuations, myocardial infarction, stroke/transient ischemic attack, and life-threatening complications (estimated to be 1 in 10,000)], benefits  (risk stratification, diagnosing coronary artery disease, treatment guidance) and alternatives of an exercise tolerance test were discussed in detail with Allen Whitney and he agrees to proceed.   Medication Adjustments/Labs and Tests Ordered: Current medicines are reviewed at length with the patient today.  Concerns regarding medicines are outlined above.  Tests Ordered: Orders Placed This Encounter  Procedures   EKG 12-Lead    Medication Changes: No orders of the defined types were placed in this encounter.   Disposition:  Follow up in 1 year(s)  Signed, Carlo Guevarra  Mayford Knife, MD  04/29/2021 9:33 AM    [facility]

## 2021-04-29 ENCOUNTER — Ambulatory Visit: Payer: BC Managed Care – PPO | Admitting: Cardiology

## 2021-04-29 ENCOUNTER — Other Ambulatory Visit: Payer: Self-pay

## 2021-04-29 ENCOUNTER — Encounter: Payer: Self-pay | Admitting: Cardiology

## 2021-04-29 VITALS — BP 122/80 | HR 60 | Ht 70.0 in | Wt 207.4 lb

## 2021-04-29 DIAGNOSIS — I472 Ventricular tachycardia: Secondary | ICD-10-CM | POA: Diagnosis not present

## 2021-04-29 DIAGNOSIS — R0789 Other chest pain: Secondary | ICD-10-CM

## 2021-04-29 DIAGNOSIS — I10 Essential (primary) hypertension: Secondary | ICD-10-CM | POA: Diagnosis not present

## 2021-04-29 DIAGNOSIS — I493 Ventricular premature depolarization: Secondary | ICD-10-CM | POA: Diagnosis not present

## 2021-04-29 DIAGNOSIS — E669 Obesity, unspecified: Secondary | ICD-10-CM

## 2021-04-29 DIAGNOSIS — I491 Atrial premature depolarization: Secondary | ICD-10-CM

## 2021-04-29 DIAGNOSIS — I4729 Other ventricular tachycardia: Secondary | ICD-10-CM

## 2021-04-29 DIAGNOSIS — Z9989 Dependence on other enabling machines and devices: Secondary | ICD-10-CM

## 2021-04-29 DIAGNOSIS — G4733 Obstructive sleep apnea (adult) (pediatric): Secondary | ICD-10-CM

## 2021-04-29 MED ORDER — NEBIVOLOL HCL 5 MG PO TABS: 5.0000 mg | ORAL_TABLET | Freq: Every day | ORAL | 3 refills | Status: DC

## 2021-04-29 MED ORDER — AMLODIPINE BESYLATE 5 MG PO TABS: 7.5000 mg | ORAL_TABLET | Freq: Every day | ORAL | 3 refills | Status: DC

## 2021-04-29 NOTE — Patient Instructions (Addendum)
Medication Instructions:  Your physician has recommended you make the following change in your medication:   STOP: metoprolol 2.   START: bystolic 5 mg tablet: Take 1 tablet by mouth once a day   *If you need a refill on your cardiac medications before your next appointment, please call your pharmacy*   Lab Work: None  If you have labs (blood work) drawn today and your tests are completely normal, you will receive your results only by: MyChart Message (if you have MyChart) OR A paper copy in the mail If you have any lab test that is abnormal or we need to change your treatment, we will call you to review the results.   Testing/Procedures: Your physician has requested that you have an exercise tolerance test. For further information please visit [email or web address] Please also follow instruction sheet, as given.  Your physician has requested that you have a Calcium Score CT scan    Follow-Up: At [facility], you and your health needs are our priority.  As part of our continuing mission to provide you with exceptional heart care, we have created designated Provider Care Teams.  These Care Teams include your primary Cardiologist (physician) and Advanced Practice Providers (APPs -  Physician Assistants and Nurse Practitioners) who all work together to provide you with the care you need, when you need it.  We recommend signing up for the patient portal called "MyChart".  Sign up information is provided on this After Visit Summary.  MyChart is used to connect with patients for Virtual Visits (Telemedicine).  Patients are able to view lab/test results, encounter notes, upcoming appointments, etc.  Non-urgent messages can be sent to your provider as well.   To learn more about what you can do with MyChart, go to [email or web address].    Your next appointment:   1 year(s)  The format for your next appointment:   In Person  Provider:   Armanda Magic, MD   Other  Instructions None

## 2021-05-07 DIAGNOSIS — G4733 Obstructive sleep apnea (adult) (pediatric): Secondary | ICD-10-CM | POA: Diagnosis not present

## 2021-05-15 ENCOUNTER — Other Ambulatory Visit: Payer: Self-pay

## 2021-05-15 DIAGNOSIS — R079 Chest pain, unspecified: Secondary | ICD-10-CM

## 2021-05-22 ENCOUNTER — Ambulatory Visit (INDEPENDENT_AMBULATORY_CARE_PROVIDER_SITE_OTHER): Payer: BC Managed Care – PPO

## 2021-05-22 ENCOUNTER — Other Ambulatory Visit: Payer: Self-pay

## 2021-05-22 ENCOUNTER — Ambulatory Visit (INDEPENDENT_AMBULATORY_CARE_PROVIDER_SITE_OTHER)
Admission: RE | Admit: 2021-05-22 | Discharge: 2021-05-22 | Disposition: A | Discharge status: Home / Self Care | Payer: Self-pay | Source: Ambulatory Visit | Attending: Cardiology | Admitting: Cardiology

## 2021-05-22 DIAGNOSIS — I10 Essential (primary) hypertension: Secondary | ICD-10-CM

## 2021-05-22 DIAGNOSIS — M1712 Unilateral primary osteoarthritis, left knee: Secondary | ICD-10-CM | POA: Diagnosis not present

## 2021-05-22 DIAGNOSIS — R0789 Other chest pain: Secondary | ICD-10-CM | POA: Diagnosis not present

## 2021-05-22 LAB — EXERCISE TOLERANCE TEST
Estimated workload: 10.1 METS
Exercise duration (min): 9 min
Exercise duration (sec): 0 s
MPHR: 159 {beats}/min
Peak HR: 137 {beats}/min
Percent HR: 86 %
RPE: 17
Rest HR: 72 {beats}/min

## 2021-05-26 ENCOUNTER — Telehealth: Payer: Self-pay

## 2021-05-26 ENCOUNTER — Other Ambulatory Visit: Payer: Self-pay

## 2021-05-26 DIAGNOSIS — I472 Ventricular tachycardia: Secondary | ICD-10-CM

## 2021-05-26 DIAGNOSIS — I10 Essential (primary) hypertension: Secondary | ICD-10-CM

## 2021-05-26 DIAGNOSIS — R079 Chest pain, unspecified: Secondary | ICD-10-CM

## 2021-05-26 DIAGNOSIS — I4729 Other ventricular tachycardia: Secondary | ICD-10-CM

## 2021-05-26 NOTE — Telephone Encounter (Signed)
Allen Reichert, MD  05/23/2021 11:53 AM EDT Back to Top     Please let patient know that stress test was fine except for elevated Bp withexercise.  Please get a 48 hour BP monitor  The patient has been notified of the result and verbalized understanding.  All questions (if any) were answered. Theresia Majors, RN 05/26/2021 1:18 PM

## 2021-05-26 NOTE — Telephone Encounter (Signed)
-----   Message from Quintella Reichert, MD sent at 05/23/2021 11:52 AM EDT ----- Coronary Ca score elevated at 1921 which is very high.  Non cardiac portion showed plaque in the aorta as well as a very small RLL pulmonary nodule 77mm, ? Mild cirrhosis and GERD or esophageal dysmotility - please forward study to his PCP to followup on non cardiac portions of CT.  He is on statin therapy - please repeat FLP and ALT.  Stress test showed no ischemia - he needs to let me know if he develops chest pain or pressure or DOE otherwise continue medical therapy to lower cholesterol, low fat diet and exercise.

## 2021-06-04 ENCOUNTER — Telehealth: Payer: Self-pay | Admitting: *Deleted

## 2021-06-04 ENCOUNTER — Other Ambulatory Visit: Payer: Self-pay | Admitting: Cardiology

## 2021-06-04 DIAGNOSIS — I10 Essential (primary) hypertension: Secondary | ICD-10-CM

## 2021-06-04 DIAGNOSIS — I4729 Other ventricular tachycardia: Secondary | ICD-10-CM

## 2021-06-04 DIAGNOSIS — I472 Ventricular tachycardia: Secondary | ICD-10-CM

## 2021-06-04 DIAGNOSIS — R030 Elevated blood-pressure reading, without diagnosis of hypertension: Secondary | ICD-10-CM

## 2021-06-04 DIAGNOSIS — R079 Chest pain, unspecified: Secondary | ICD-10-CM

## 2021-06-04 NOTE — Telephone Encounter (Signed)
Scheduled 24 hour ambulatory blood pressure monitor appointment for Monday , 06/09/2021 at 1:30PM. Aware patient may come between 1:00 and 2:00 pm.

## 2021-06-04 NOTE — Addendum Note (Signed)
Addended by: Theresia Majors on: 06/04/2021 04:27 PM   Modules accepted: Orders

## 2021-06-09 ENCOUNTER — Other Ambulatory Visit: Payer: BC Managed Care – PPO | Admitting: *Deleted

## 2021-06-09 ENCOUNTER — Ambulatory Visit (INDEPENDENT_AMBULATORY_CARE_PROVIDER_SITE_OTHER): Payer: BC Managed Care – PPO

## 2021-06-09 ENCOUNTER — Other Ambulatory Visit: Payer: Self-pay

## 2021-06-09 DIAGNOSIS — I10 Essential (primary) hypertension: Secondary | ICD-10-CM | POA: Diagnosis not present

## 2021-06-09 DIAGNOSIS — I472 Ventricular tachycardia: Secondary | ICD-10-CM

## 2021-06-09 DIAGNOSIS — I4729 Other ventricular tachycardia: Secondary | ICD-10-CM

## 2021-06-09 DIAGNOSIS — R079 Chest pain, unspecified: Secondary | ICD-10-CM

## 2021-06-09 DIAGNOSIS — R030 Elevated blood-pressure reading, without diagnosis of hypertension: Secondary | ICD-10-CM

## 2021-06-09 NOTE — Progress Notes (Unsigned)
24 hour ambulatory blood pressure monitor applied to patient using standard adult cuff. 

## 2021-06-10 LAB — ALT: ALT: 29 IU/L (ref 0–44)

## 2021-06-10 LAB — LIPID PANEL
Chol/HDL Ratio: 2.9 ratio (ref 0.0–5.0)
Cholesterol, Total: 135 mg/dL (ref 100–199)
HDL: 47 mg/dL (ref >39)
LDL Chol Calc (NIH): 66 mg/dL (ref 0–99)
Triglycerides: 126 mg/dL (ref 0–149)
VLDL Cholesterol Cal: 22 mg/dL (ref 5–40)

## 2021-08-06 DIAGNOSIS — G4733 Obstructive sleep apnea (adult) (pediatric): Secondary | ICD-10-CM | POA: Diagnosis not present

## 2021-08-26 DIAGNOSIS — Z419 Encounter for procedure for purposes other than remedying health state, unspecified: Secondary | ICD-10-CM | POA: Diagnosis not present

## 2021-08-29 DIAGNOSIS — H353212 Exudative age-related macular degeneration, right eye, with inactive choroidal neovascularization: Secondary | ICD-10-CM | POA: Diagnosis not present

## 2021-09-06 DIAGNOSIS — G4733 Obstructive sleep apnea (adult) (pediatric): Secondary | ICD-10-CM | POA: Diagnosis not present

## 2021-09-22 DIAGNOSIS — M1712 Unilateral primary osteoarthritis, left knee: Secondary | ICD-10-CM | POA: Diagnosis not present

## 2021-10-06 DIAGNOSIS — G4733 Obstructive sleep apnea (adult) (pediatric): Secondary | ICD-10-CM | POA: Diagnosis not present

## 2021-11-06 DIAGNOSIS — G4733 Obstructive sleep apnea (adult) (pediatric): Secondary | ICD-10-CM | POA: Diagnosis not present

## 2021-11-11 DIAGNOSIS — L570 Actinic keratosis: Secondary | ICD-10-CM | POA: Diagnosis not present

## 2021-11-11 DIAGNOSIS — L578 Other skin changes due to chronic exposure to nonionizing radiation: Secondary | ICD-10-CM | POA: Diagnosis not present

## 2021-11-11 DIAGNOSIS — L821 Other seborrheic keratosis: Secondary | ICD-10-CM | POA: Diagnosis not present

## 2021-12-02 ENCOUNTER — Other Ambulatory Visit: Payer: Self-pay | Admitting: Cardiology

## 2021-12-07 DIAGNOSIS — G4733 Obstructive sleep apnea (adult) (pediatric): Secondary | ICD-10-CM | POA: Diagnosis not present

## 2021-12-25 DIAGNOSIS — Z96652 Presence of left artificial knee joint: Secondary | ICD-10-CM | POA: Diagnosis not present

## 2022-01-04 DIAGNOSIS — G4733 Obstructive sleep apnea (adult) (pediatric): Secondary | ICD-10-CM | POA: Diagnosis not present

## 2022-02-18 DIAGNOSIS — Z0000 Encounter for general adult medical examination without abnormal findings: Secondary | ICD-10-CM | POA: Diagnosis not present

## 2022-02-18 DIAGNOSIS — Z13220 Encounter for screening for lipoid disorders: Secondary | ICD-10-CM | POA: Diagnosis not present

## 2022-02-18 DIAGNOSIS — Z125 Encounter for screening for malignant neoplasm of prostate: Secondary | ICD-10-CM | POA: Diagnosis not present

## 2022-02-18 DIAGNOSIS — Z8739 Personal history of other diseases of the musculoskeletal system and connective tissue: Secondary | ICD-10-CM | POA: Diagnosis not present

## 2022-03-16 DIAGNOSIS — U071 COVID-19: Secondary | ICD-10-CM | POA: Diagnosis not present

## 2022-04-23 ENCOUNTER — Other Ambulatory Visit: Payer: Self-pay | Admitting: Cardiology

## 2022-05-12 ENCOUNTER — Ambulatory Visit: Payer: BC Managed Care – PPO | Admitting: Cardiology

## 2022-05-12 ENCOUNTER — Encounter: Payer: Self-pay | Admitting: Cardiology

## 2022-05-12 VITALS — BP 124/74 | HR 63 | Ht 70.0 in | Wt 226.4 lb

## 2022-05-12 DIAGNOSIS — R931 Abnormal findings on diagnostic imaging of heart and coronary circulation: Secondary | ICD-10-CM

## 2022-05-12 DIAGNOSIS — G4733 Obstructive sleep apnea (adult) (pediatric): Secondary | ICD-10-CM

## 2022-05-12 DIAGNOSIS — Z9989 Dependence on other enabling machines and devices: Secondary | ICD-10-CM

## 2022-05-12 DIAGNOSIS — I491 Atrial premature depolarization: Secondary | ICD-10-CM

## 2022-05-12 DIAGNOSIS — I493 Ventricular premature depolarization: Secondary | ICD-10-CM

## 2022-05-12 DIAGNOSIS — I4729 Other ventricular tachycardia: Secondary | ICD-10-CM | POA: Diagnosis not present

## 2022-05-12 DIAGNOSIS — E785 Hyperlipidemia, unspecified: Secondary | ICD-10-CM

## 2022-05-12 DIAGNOSIS — I10 Essential (primary) hypertension: Secondary | ICD-10-CM

## 2022-05-12 MED ORDER — NEBIVOLOL HCL 5 MG PO TABS: 7.5000 mg | ORAL_TABLET | Freq: Every day | ORAL | 3 refills | Status: DC

## 2022-05-12 NOTE — Patient Instructions (Signed)
CHECK YOUR BLOOD PRESSURE DAILY FOR ONE WEEK AND CALL us WITH A LIST OF YOUR READINGS  Medication Instructions:  Your physician has recommended you make the following change in your medication:  1) STOP taking amlodipine  2) INCREASE Bystolic (nebivolol) to 7.5 mg daily  *If you need a refill on your cardiac medications before your next appointment, please call your pharmacy*  Testing/Procedures: Your physician has requested that you have a carotid duplex in January 2024. This test is an ultrasound of the carotid arteries in your neck. It looks at blood flow through these arteries that supply the brain with blood. Allow one hour for this exam. There are no restrictions or special instructions.  Follow-Up: At [facility], you and your health needs are our priority.  As part of our continuing mission to provide you with exceptional heart care, we have created designated Provider Care Teams.  These Care Teams include your primary Cardiologist (physician) and Advanced Practice Providers (APPs -  Physician Assistants and Nurse Practitioners) who all work together to provide you with the care you need, when you need it.  Your next appointment:   1 year(s)  The format for your next appointment:   In Person  Provider:   Armanda Magic, MD  Important Information About Sugar

## 2022-05-12 NOTE — Addendum Note (Signed)
Addended by: Theresia Majors on: 05/12/2022 11:23 AM   Modules accepted: Orders

## 2022-05-12 NOTE — Progress Notes (Signed)
Date:  05/12/2022   ID:  Allen Whitney, DOB February 05, 1961, MRN [id number]  PCP:  Daisy Floro, MD  Sleep Medicine:  Armanda Magic, MD Electrophysiologist:  None   Chief Complaint: OSA, HTN, Obesity, PACs, NSVT, PVCs  History of Present Illness:    Allen Whitney is a 61 y.o. male  with a hx of OSA on CPAP, PVCs, PACs, hypertension.    He is here today for followup and is doing well.  He denies any chest pain or pressure, SOB, DOE, PND, orthopnea, dizziness or syncope. He occasionally has some LE edema from standing too long. He occasionally has palpitations that is skipped heart beats in a row.  He does admit to drinking some caffeine and denies any ETOH. He is compliant with his meds and is tolerating meds with no SE.     He is doing well with his CPAP device and thinks that he has gotten used to it.  He tolerates the mask and feels the pressure is adequate.  He sleeps well at night and only gets up to go to the bathroom.  He denies any significant mouth or nasal dryness or nasal congestion.  He does not think that he snores.     Prior CV studies:   The following studies were reviewed today:  PAP compliance download  Past Medical History:  Diagnosis Date   Agatston coronary artery calcium score greater than 400    Coronary calcium score 05/22/2021 was markedly elevated at 1921 which was 90th percentile for age, gender and race matched controls 04/2021   Bilateral carotid artery disease (HCC)    1-39% bilateral by dopplers 10/2020   Diverticulitis    Gout attack 07/07/2013   Hypertension 01/10/2018   PE (pulmonary embolism)    left lung   Renal insufficiency, mild 07/07/2013   Sleep apnea    uses CPAP at night   Past Surgical History:  Procedure Laterality Date   CHOLECYSTECTOMY N/A 07/01/2013   Procedure: LAPAROSCOPIC CHOLECYSTECTOMY;  Surgeon: Adolph Pollack, MD;  Location: [facility];  Service: General;  Laterality: N/A;   TONSILLECTOMY     UMBILICAL HERNIA REPAIR  N/A 07/01/2013   Procedure: HERNIA REPAIR UMBILICAL ADULT;  Surgeon: Adolph Pollack, MD;  Location: [facility];  Service: General;  Laterality: N/A;     Current Meds  Medication Sig   amLODipine (NORVASC) 5 MG tablet Take 1.5 tablets (7.5 mg total) by mouth daily.   aspirin EC 81 MG tablet Take 1 tablet (81 mg total) by mouth daily. Swallow whole.   atorvastatin (LIPITOR) 20 MG tablet TAKE 1 TABLET BY MOUTH EVERY DAY   Multiple Vitamins-Minerals (OCUVITE EYE HEALTH FORMULA PO) Take 1 tablet by mouth daily.   nebivolol (BYSTOLIC) 5 MG tablet TAKE 1 TABLET (5 MG TOTAL) BY MOUTH DAILY.     Allergies:   Patient has no known allergies.   Social History   Tobacco Use   Smoking status: Never   Smokeless tobacco: Never  Substance Use Topics   Alcohol use: No   Drug use: No     Family Hx: The patient's family history includes Cataracts in his father; Heart attack in his mother; Macular degeneration in his mother.  ROS:   Please see the history of present illness.     All other systems reviewed and are negative.   Labs/Other Tests and Data Reviewed:    Recent Labs: 06/09/2021: ALT 29   Recent Lipid Panel Lab Results  Component Value  Date/Time   CHOL 135 06/09/2021 01:18 PM   TRIG 126 06/09/2021 01:18 PM   HDL 47 06/09/2021 01:18 PM   CHOLHDL 2.9 06/09/2021 01:18 PM   LDLCALC 66 06/09/2021 01:18 PM    Wt Readings from Last 3 Encounters:  05/12/22 226 lb 6.4 oz (102.7 kg)  04/29/21 207 lb 6.4 oz (94.1 kg)  04/18/20 223 lb (101.2 kg)     Objective:    Vital Signs:  BP 124/74   Pulse 63   Ht 5\' 10"  (1.778 m)   Wt 226 lb 6.4 oz (102.7 kg)   SpO2 95%   BMI 32.49 kg/m    GEN: Well nourished, well developed in no acute distress HEENT: Normal NECK: No JVD; No carotid bruits LYMPHATICS: No lymphadenopathy CARDIAC:RRR, no murmurs, rubs, gallops RESPIRATORY:  Clear to auscultation without rales, wheezing or rhonchi  ABDOMEN: Soft, non-tender,  non-distended MUSCULOSKELETAL:  No edema; No deformity  SKIN: Warm and dry NEUROLOGIC:  Alert and oriented x 3 PSYCHIATRIC:  Normal affect    EKG was performed in the office today and showed NSR with early repolarization and LVH by repol ASSESSMENT & PLAN:    1.  OSA - The patient is tolerating PAP therapy well without any problems. The PAP download performed by his DME was personally reviewed and interpreted by me today and showed an AHI of 0.8 /hr on auto CPAP from 4-18 cm H2O with 93% compliance in using more than 4 hours nightly.  The patient has been using and benefiting from PAP use and will continue to benefit from therapy.    2.  HTN -BP is adequately controlled on exam today -stop amlodipine and increase Bystolic to 7.5mg  daily to help suppress his palpitations -check BP and HR daily for a week and call with results  3.  Palpitations/PVCs/NSVT/PACs -event monitor in 2019 showed PVCs and PACs and NSVT -cardiac MRI and ETT were normal -he has had some increase in his palpitations -Increase Bystolic to 7.5mg  daily  4.  Coronary artery calcifications -Coronary calcium score 05/22/2021 was markedly elevated at 1921 which was 90th percentile for age, gender and race matched controls. -Exercise treadmill test showed no evidence of ischemia 04-2021 -He denies any anginal symptoms -Continue prescription drug management with aspirin 81 mg daily, beta-blocker and statin therapy  5.  Carotid artery stenosis -Dopplers 10/2020 showed bilateral 1 to 39% stenosis -Continue statin therapy and aspirin -Repeat Dopplers 10/2022  6.  Hyperlipidemia -LDL goal less than 70 -I have personally reviewed and interpreted outside labs performed by patient's PCP which showed LDL 46, HDL 39, triglycerides 130 and ALT 23 on 02/18/2022 -Continue prescription drug management with atorvastatin 20 mg daily with as needed refills  Medication Adjustments/Labs and Tests Ordered: Current medicines are reviewed at  length with the patient today.  Concerns regarding medicines are outlined above.  Tests Ordered: Orders Placed This Encounter  Procedures   EKG 12-Lead    Medication Changes: No orders of the defined types were placed in this encounter.    Disposition:  Follow up in 1 year(s)  Signed, 02/20/2022, MD  05/12/2022 11:11 AM    [facility]

## 2022-05-13 ENCOUNTER — Encounter: Payer: Self-pay | Admitting: Cardiology

## 2022-05-20 ENCOUNTER — Encounter: Payer: Self-pay | Admitting: Cardiology

## 2022-05-21 ENCOUNTER — Other Ambulatory Visit: Payer: Self-pay | Admitting: Cardiology

## 2022-05-22 NOTE — Telephone Encounter (Signed)
Called and spoke with patient who understands to increase Bystolic to 2 tablets daily (10mg ) and he will continue to check heart rate and pressure and report back to in 1 week.   I advised if he needs a new Rx called into the pharmacy to reflect increased directions, to let us know. Otherwise, we will plan to update Rx on file once we establish a daily dosing.   [place], MD  Quintella Reichert K Cc: Eugene Gavia, RN Increase Bystolic to 10mg  daily and check BP and HR daily for a week anc call with results

## 2022-05-27 ENCOUNTER — Other Ambulatory Visit: Payer: Self-pay | Admitting: Cardiology

## 2022-07-25 ENCOUNTER — Other Ambulatory Visit: Payer: Self-pay | Admitting: Cardiology

## 2022-09-25 DIAGNOSIS — H353212 Exudative age-related macular degeneration, right eye, with inactive choroidal neovascularization: Secondary | ICD-10-CM | POA: Diagnosis not present

## 2022-10-20 DIAGNOSIS — Z96652 Presence of left artificial knee joint: Secondary | ICD-10-CM | POA: Diagnosis not present

## 2022-10-25 DIAGNOSIS — G4733 Obstructive sleep apnea (adult) (pediatric): Secondary | ICD-10-CM | POA: Diagnosis not present

## 2022-11-10 ENCOUNTER — Ambulatory Visit (HOSPITAL_COMMUNITY)
Admission: RE | Admit: 2022-11-10 | Discharge: 2022-11-10 | Disposition: A | Discharge status: Home / Self Care | Payer: BC Managed Care – PPO | Source: Ambulatory Visit | Attending: Cardiology | Admitting: Cardiology

## 2022-11-10 DIAGNOSIS — I493 Ventricular premature depolarization: Secondary | ICD-10-CM | POA: Diagnosis not present

## 2022-11-10 DIAGNOSIS — R931 Abnormal findings on diagnostic imaging of heart and coronary circulation: Secondary | ICD-10-CM | POA: Diagnosis not present

## 2022-11-10 DIAGNOSIS — G4733 Obstructive sleep apnea (adult) (pediatric): Secondary | ICD-10-CM | POA: Diagnosis not present

## 2022-11-10 DIAGNOSIS — I491 Atrial premature depolarization: Secondary | ICD-10-CM | POA: Diagnosis not present

## 2022-11-10 DIAGNOSIS — I4729 Other ventricular tachycardia: Secondary | ICD-10-CM | POA: Diagnosis not present

## 2022-11-10 DIAGNOSIS — I10 Essential (primary) hypertension: Secondary | ICD-10-CM | POA: Insufficient documentation

## 2022-11-10 DIAGNOSIS — E785 Hyperlipidemia, unspecified: Secondary | ICD-10-CM | POA: Insufficient documentation

## 2022-11-12 DIAGNOSIS — L570 Actinic keratosis: Secondary | ICD-10-CM | POA: Diagnosis not present

## 2022-11-12 DIAGNOSIS — L821 Other seborrheic keratosis: Secondary | ICD-10-CM | POA: Diagnosis not present

## 2022-11-12 DIAGNOSIS — L853 Xerosis cutis: Secondary | ICD-10-CM | POA: Diagnosis not present

## 2022-11-30 ENCOUNTER — Other Ambulatory Visit: Payer: Self-pay | Admitting: Cardiology

## 2023-02-26 DIAGNOSIS — Z8739 Personal history of other diseases of the musculoskeletal system and connective tissue: Secondary | ICD-10-CM | POA: Diagnosis not present

## 2023-02-26 DIAGNOSIS — Z125 Encounter for screening for malignant neoplasm of prostate: Secondary | ICD-10-CM | POA: Diagnosis not present

## 2023-02-26 DIAGNOSIS — Z13220 Encounter for screening for lipoid disorders: Secondary | ICD-10-CM | POA: Diagnosis not present

## 2023-02-26 DIAGNOSIS — Z0000 Encounter for general adult medical examination without abnormal findings: Secondary | ICD-10-CM | POA: Diagnosis not present

## 2023-03-14 ENCOUNTER — Other Ambulatory Visit: Payer: Self-pay | Admitting: Cardiology

## 2023-03-19 ENCOUNTER — Other Ambulatory Visit: Payer: Self-pay | Admitting: Cardiology

## 2023-03-19 MED ORDER — NEBIVOLOL HCL 5 MG PO TABS: 10.0000 mg | ORAL_TABLET | Freq: Every day | ORAL | 0 refills | Status: DC

## 2023-03-19 NOTE — Telephone Encounter (Signed)
Pt stated that Dr. Mayford Knife increased his medication nebivolol to 2 tablet daily. This was not done on pt's medication list. Please address

## 2023-03-19 NOTE — Telephone Encounter (Signed)
Patient requesting refill. He was put on 7.5 mg of bystolic on 05/12/22 to control BP as well as palps. Dose was increased to 10 mg on 06/04/22 and he was asked to provide BP log. Called and spoke to patient, he states he has not been tracking his BP/HR but at his PCP visit on 02/26/23 his bp was 133/79 and HR was 52. He states his palpitations have been improved on 10  mg dose. Spoke with Dr. Excell Seltzer who agrees to reorder as has been out of his meds for 2 days. Script sent to pharmacy of choice, patient w/ yearly follow up in October.

## 2023-03-19 NOTE — Telephone Encounter (Signed)
*  STAT* If patient is at the pharmacy, call can be transferred to refill team.   1. Which medications need to be refilled? (please list name of each medication and dose if known)   nebivolol (BYSTOLIC) 5 MG tablet   2. Which pharmacy/location (including street and city if local pharmacy) is medication to be sent to?  [id number] - [place], [place] - [address]   3. Do they need a 30 day or 90 day supply?   90 day  Patient stated he is completely out of this medication.  Patient has appointment scheduled on 10/9.

## 2023-05-04 ENCOUNTER — Other Ambulatory Visit: Payer: Self-pay | Admitting: Cardiovascular Disease

## 2023-05-07 ENCOUNTER — Other Ambulatory Visit (HOSPITAL_COMMUNITY): Payer: Self-pay

## 2023-05-07 ENCOUNTER — Telehealth: Payer: Self-pay

## 2023-05-07 NOTE — Telephone Encounter (Signed)
  P/a req by cma, but not need after verifying via test claim and PharmD

## 2023-05-07 NOTE — Telephone Encounter (Signed)
Before a P/A is started we'll need an active script on file for the bisopropolol..qty, sig etc.

## 2023-05-07 NOTE — Telephone Encounter (Signed)
Recommend changing to bisoprolol 10mg  - 1 tablet daily. Shouldn't need a PA then, looks like plan doesn't want to cover more than 1 tab a day and current rx on file is for 2 of the 5mg  daily.

## 2023-06-22 ENCOUNTER — Other Ambulatory Visit: Payer: Self-pay | Admitting: Cardiology

## 2023-08-04 ENCOUNTER — Encounter: Payer: Self-pay | Admitting: Cardiology

## 2023-08-04 ENCOUNTER — Ambulatory Visit: Payer: BC Managed Care – PPO | Attending: Cardiology | Admitting: Cardiology

## 2023-08-04 VITALS — BP 162/80 | HR 55 | Ht 70.0 in | Wt 229.6 lb

## 2023-08-04 DIAGNOSIS — I10 Essential (primary) hypertension: Secondary | ICD-10-CM

## 2023-08-04 DIAGNOSIS — R931 Abnormal findings on diagnostic imaging of heart and coronary circulation: Secondary | ICD-10-CM

## 2023-08-04 DIAGNOSIS — I493 Ventricular premature depolarization: Secondary | ICD-10-CM

## 2023-08-04 DIAGNOSIS — G4733 Obstructive sleep apnea (adult) (pediatric): Secondary | ICD-10-CM

## 2023-08-04 DIAGNOSIS — I4729 Other ventricular tachycardia: Secondary | ICD-10-CM | POA: Diagnosis not present

## 2023-08-04 DIAGNOSIS — E785 Hyperlipidemia, unspecified: Secondary | ICD-10-CM

## 2023-08-04 NOTE — Patient Instructions (Signed)
Medication Instructions:  Your physician recommends that you continue on your current medications as directed. Please refer to the Current Medication list given to you today.  *If you need a refill on your cardiac medications before your next appointment, please call your pharmacy*   Lab Work: None.  If you have labs (blood work) drawn today and your tests are completely normal, you will receive your results only by: MyChart Message (if you have MyChart) OR A paper copy in the mail If you have any lab test that is abnormal or we need to change your treatment, we will call you to review the results.   Testing/Procedures: None.   Follow-Up:  Your next appointment:   1 year(s)  Provider:   Dr. Armanda Magic, MD    Other Instructions Please check on whether you are taking amlodipine and call our office to have your medication list updated.

## 2023-08-04 NOTE — Progress Notes (Signed)
Date:  08/04/2023   ID:  [name], DOB 06/14/1961, MRN [id number]  PCP:  Daisy Floro, MD  Sleep Medicine:  Armanda Magic, MD Electrophysiologist:  None   Chief Complaint: OSA, HTN, Obesity, PACs, NSVT, PVCs  History of Present Illness:    Allen Whitney is a 62 y.o. male  with a hx of OSA on CPAP, PVCs, PACs, hypertension.    he is here today for followup and is doing well.  He denies any chest pain or pressure, PND, orthopnea, dizziness or syncope. He has noticed some DOE but has been very sedentary.  He occasionally will have mild LE edema at the end of the day but he stands on his feet on concrete floor all day at work. Occasionally he will have have skipped heart beats sometimes with a few in a row that resolve with cough but only last 10 seconds. These are infrequent occurring maybe once a month. He is compliant with his meds and is tolerating meds with no SE.    He is doing well with his PAP device and thinks that he has gotten used to it.  He tolerates the mask and feels the pressure is adequate. He denies any significant mouth or nasal dryness or nasal congestion.  He does not think that he snores.     Prior CV studies:   The following studies were reviewed today:  PAP compliance download  Past Medical History:  Diagnosis Date   Agatston coronary artery calcium score greater than 400    Coronary calcium score 05/22/2021 was markedly elevated at 1921 which was 90th percentile for age, gender and race matched controls 04/2021   Bilateral carotid artery disease (HCC)    1-39% bilateral by dopplers 10/2020   Diverticulitis    Gout attack 07/07/2013   Hypertension 01/10/2018   PE (pulmonary embolism)    left lung   Renal insufficiency, mild 07/07/2013   Sleep apnea    uses CPAP at night   Past Surgical History:  Procedure Laterality Date   CHOLECYSTECTOMY N/A 07/01/2013   Procedure: LAPAROSCOPIC CHOLECYSTECTOMY;  Surgeon: Adolph Pollack, MD;  Location: [facility];  Service: General;  Laterality: N/A;   TONSILLECTOMY     UMBILICAL HERNIA REPAIR N/A 07/01/2013   Procedure: HERNIA REPAIR UMBILICAL ADULT;  Surgeon: Adolph Pollack, MD;  Location: [facility];  Service: General;  Laterality: N/A;     Current Meds  Medication Sig   aspirin EC 81 MG tablet Take 1 tablet (81 mg total) by mouth daily. Swallow whole.     Allergies:   Patient has no known allergies.   Social History   Tobacco Use   Smoking status: Never   Smokeless tobacco: Never  Substance Use Topics   Alcohol use: No   Drug use: No     Family Hx: The patient's family history includes Cataracts in his father; Heart attack in his mother; Macular degeneration in his mother.  ROS:   Please see the history of present illness.     All other systems reviewed and are negative.   Labs/Other Tests and Data Reviewed:    Recent Labs: No results found for requested labs within last 365 days.   Recent Lipid Panel Lab Results  Component Value Date/Time   CHOL 135 06/09/2021 01:18 PM   TRIG 126 06/09/2021 01:18 PM   HDL 47 06/09/2021 01:18 PM   CHOLHDL 2.9 06/09/2021 01:18 PM   LDLCALC 66 06/09/2021 01:18 PM  Wt Readings from Last 3 Encounters:  08/04/23 229 lb 9.6 oz (104.1 kg)  05/12/22 226 lb 6.4 oz (102.7 kg)  04/29/21 207 lb 6.4 oz (94.1 kg)     Objective:    Vital Signs:  BP (!) 162/80   Pulse (!) 55   Ht 5\' 10"  (1.778 m)   Wt 229 lb 9.6 oz (104.1 kg)   SpO2 99%   BMI 32.94 kg/m    GEN: Well nourished, well developed in no acute distress HEENT: Normal NECK: No JVD; No carotid bruits LYMPHATICS: No lymphadenopathy CARDIAC:RRR, no murmurs, rubs, gallops RESPIRATORY:  Clear to auscultation without rales, wheezing or rhonchi  ABDOMEN: Soft, non-tender, non-distended MUSCULOSKELETAL:  No edema; No deformity  SKIN: Warm and dry NEUROLOGIC:  Alert and oriented x 3 PSYCHIATRIC:  Normal affect  ASSESSMENT & PLAN:    1.  OSA - The patient is tolerating PAP  therapy well without any problems. The PAP download performed by his DME was personally reviewed and interpreted by me today and showed an AHI of 1 /hr on auto CPAP from 4-18 cm H2O with 100% compliance in using more than 4 hours nightly.  The patient has been using and benefiting from PAP use and will continue to benefit from therapy.    2.  HTN -BP is well-controlled on exam today -Continue prescription drug managed with amlodipine 5 mg daily and bisoprolol 10 mg daily with as needed refills -I have asked him to check his BP twice daily for a week and call with results  3.  Palpitations/PVCs/NSVT/PACs -event monitor in 2019 showed PVCs and PACs and NSVT -cardiac MRI and ETT were normal -palpitations are well-controlled on beta blocker -Continue prescription drug management with bisoprolol 10 mg daily with as needed refills  4.  Coronary artery calcifications -Coronary calcium score 05/22/2021 was markedly elevated at 1921 which was 90th percentile for age, gender and race matched controls. -Exercise treadmill test showed no evidence of ischemia 04-2021 -He denies any anginal symptoms since I saw him last -Continue prescription drug management with aspirin 81 mg daily and atorvastatin 20 mg daily with as needed refills  5.  Carotid artery stenosis -Dopplers 10/2020 showed bilateral 1 to 39% stenosis and repeat Dopplers 11/14/2022 showed no evidence of carotid stenosis  6.  Hyperlipidemia -LDL goal less than 70 -I have personally reviewed and interpreted outside labs performed by patient's PCP which showed LDL 47 and HDL 42 on 02/26/2023 -Continue drug management with atorvastatin 20 mg daily with as needed refills  Medication Adjustments/Labs and Tests Ordered: Current medicines are reviewed at length with the patient today.  Concerns regarding medicines are outlined above.  Tests Ordered: No orders of the defined types were placed in this encounter.   Medication Changes: No orders of  the defined types were placed in this encounter.    Disposition:  Follow up in 1 year(s)  Signed, Armanda Magic, MD  08/04/2023 8:42 AM    [facility]

## 2023-08-06 ENCOUNTER — Telehealth: Payer: Self-pay

## 2023-08-06 NOTE — Telephone Encounter (Signed)
Reviewed Pharmacy scripts, MyChart Messages and medication refill requests.   Patient previous OV prior to this year was in July 2023. On this visit, AVS instructions did direct patient to stop taking amlodipine and start taking bystolic/nebivolol.   However, on  07/25/22 7.5 mg amlodipine was ordered. Med list shows that [facility] [place] [facility].  Patient states he does remember being [facility] a [facility] by [facility] that 7.5 mg of amlodipine was ready for him near the end of last year, but he told them he didn't want it because he thought he was supposed to stop amlodipine. I can find no Mychart messages that he called our office to clarify.  [facility] that [facility] for 7.5 mg amlodipine on 07/27/22 and that it was never picked up by patient.  Patient states he believes he may have misunderstood his medication instructions and is willing to try amlodipine again. He is currently keeping a BP log that is due next week.

## 2023-08-11 NOTE — Telephone Encounter (Signed)
Call to patient to verify he takes his bisoprolol 10 mg nightly around 6:30 to 7 pm. He states he does not take bystolic/nebivolol because his insurance would not cover it.  Encounters do show that around 05/07/23 or 05/10/23 his bystolic was changed to zebeta due to insurance coverage.   Patient provides the following BP log:   08/11/23: 146/76 5:30 AM  08/09/23: 152/84 5:30 AM       142/80 9:30 pm  08/08/23: 154/84  6 AM       154/88 10 PM  08/07/23: 158/86 6 AM       156/82 9:30 PM  08/06/23: 170/88 7 AM                  158/87 9:30 pm  08/05/23: 146/76 6:30 AM       168/88 9 pm 08/04/23: 140/76 8:30 pm  Patient responses forwarded to Dr. Mayford Knife.

## 2023-08-12 MED ORDER — AMLODIPINE BESYLATE 10 MG PO TABS: 10.0000 mg | ORAL_TABLET | Freq: Every day | ORAL | 3 refills | Status: DC

## 2023-08-12 NOTE — Addendum Note (Signed)
Addended by: Luellen Pucker on: 08/12/2023 10:59 AM   Modules accepted: Orders

## 2023-08-12 NOTE — Telephone Encounter (Signed)
Call to patient to advise he continue Bisoprolol (Zebeta) at 10mg  daily and increase Amlodipine to 10mg  daily. Patient agrees to check BP twice daily for a week and call with results, orders sent to pharmacy of choice.

## 2023-08-26 ENCOUNTER — Other Ambulatory Visit: Payer: Self-pay | Admitting: Cardiology

## 2023-09-10 ENCOUNTER — Encounter: Payer: Self-pay | Admitting: Cardiology

## 2023-09-10 ENCOUNTER — Telehealth: Payer: Self-pay

## 2023-09-10 NOTE — Telephone Encounter (Signed)
Call to patient to ask for BP log/readings, no answer. Left detailed message, asked patient to call our office to submit readings or submit over Mychart.

## 2023-09-28 DIAGNOSIS — H353212 Exudative age-related macular degeneration, right eye, with inactive choroidal neovascularization: Secondary | ICD-10-CM | POA: Diagnosis not present

## 2023-11-07 ENCOUNTER — Other Ambulatory Visit: Payer: Self-pay | Admitting: Cardiology

## 2023-11-15 DIAGNOSIS — L821 Other seborrheic keratosis: Secondary | ICD-10-CM | POA: Diagnosis not present

## 2023-11-15 DIAGNOSIS — L578 Other skin changes due to chronic exposure to nonionizing radiation: Secondary | ICD-10-CM | POA: Diagnosis not present

## 2023-11-15 DIAGNOSIS — L570 Actinic keratosis: Secondary | ICD-10-CM | POA: Diagnosis not present

## 2023-11-16 DIAGNOSIS — G4733 Obstructive sleep apnea (adult) (pediatric): Secondary | ICD-10-CM | POA: Diagnosis not present

## 2023-11-23 DIAGNOSIS — G4733 Obstructive sleep apnea (adult) (pediatric): Secondary | ICD-10-CM | POA: Diagnosis not present

## 2023-11-26 ENCOUNTER — Other Ambulatory Visit: Payer: Self-pay | Admitting: Cardiovascular Disease

## 2023-11-26 NOTE — Telephone Encounter (Signed)
Pt's pharmacy is requesting a refill on bisoprolol. Dr. Mayford Knife has been seeing this pt. Would Dr. Mayford Knife like to refill this medication? Please address

## 2024-03-10 DIAGNOSIS — Z0000 Encounter for general adult medical examination without abnormal findings: Secondary | ICD-10-CM | POA: Diagnosis not present

## 2024-03-10 DIAGNOSIS — Z8739 Personal history of other diseases of the musculoskeletal system and connective tissue: Secondary | ICD-10-CM | POA: Diagnosis not present

## 2024-05-06 DIAGNOSIS — R509 Fever, unspecified: Secondary | ICD-10-CM | POA: Diagnosis not present

## 2024-05-06 DIAGNOSIS — B349 Viral infection, unspecified: Secondary | ICD-10-CM | POA: Diagnosis not present

## 2024-05-06 DIAGNOSIS — R070 Pain in throat: Secondary | ICD-10-CM | POA: Diagnosis not present

## 2024-07-28 ENCOUNTER — Other Ambulatory Visit: Payer: Self-pay | Admitting: Cardiology

## 2024-08-07 NOTE — Progress Notes (Unsigned)
 Date:  08/08/2024   ID:  Marland Keel, DOB 1960-12-12, MRN [id number]  PCP:  Okey Carlin Redbird, MD  Sleep Medicine:  Wilbert Bihari, MD Electrophysiologist:  None   Chief Complaint: OSA, HTN, Obesity, PACs, NSVT, PVCs  History of Present Illness:    Allen Whitney is a 63 y.o. male  with a hx of OSA on CPAP, PVCs, PACs, hypertension.    He is here for followup today and is doing well.  He denies any CP or pressure, LE edema, dizziness or syncope.  He notices DOE mainly with strenuous exertion out in the yard carrying 100lb bags of sand.  He has noticed some LE edema at times when he eats too much salt in diet and dose not drink a lot of water.  He also stands on concrete all day.  He still notices some palpitations from time to time that he calls flutters lasting < 5 seconds.   He is doing well with his he PAP device. He tolerates the nasal mask and feels the pressure is adequate.  He feels rested in the am for the most part but does fell sleepy during the evening and may dose off if he sits down.   He has problems with mouth dryness and tried a chin strap which did not help. It does not occur all the time. He denies any significant nasal dryness or nasal congestion.  He does not think that he snores. An Epworth Sleepiness Scale score was calculated the office today and this endorsed at 12 argues for residual daytime sleepiness but he says it is not that bad.  He does get up 3 times nightly to use the restroom.. Patient denies any episodes of bruxism, restless legs, No gagging hallucinations or cataplectic events.      Prior CV studies:   The following studies were reviewed today:  PAP compliance download  Past Medical History:  Diagnosis Date   Agatston coronary artery calcium  score greater than 400    Coronary calcium  score 05/22/2021 was markedly elevated at 1921 which was 90th percentile for age, gender and race matched controls 04/2021   Bilateral carotid artery disease     1-39% bilateral by dopplers 10/2020   Diverticulitis    Gout attack 07/07/2013   Hypertension 01/10/2018   PE (pulmonary embolism)    left lung   Renal insufficiency, mild 07/07/2013   Sleep apnea    uses CPAP at night   Past Surgical History:  Procedure Laterality Date   CHOLECYSTECTOMY N/A 07/01/2013   Procedure: LAPAROSCOPIC CHOLECYSTECTOMY;  Surgeon: Krystal JINNY Russell, MD;  Location: [facility];  Service: General;  Laterality: N/A;   TONSILLECTOMY     UMBILICAL HERNIA REPAIR N/A 07/01/2013   Procedure: HERNIA REPAIR UMBILICAL ADULT;  Surgeon: Krystal JINNY Russell, MD;  Location: [facility];  Service: General;  Laterality: N/A;     Current Meds  Medication Sig   amLODipine  (NORVASC ) 10 MG tablet TAKE 1 TABLET BY MOUTH EVERY DAY   aspirin  EC 81 MG tablet Take 1 tablet (81 mg total) by mouth daily. Swallow whole.   atorvastatin  (LIPITOR) 20 MG tablet TAKE 1 TABLET BY MOUTH EVERY DAY   bisoprolol (ZEBETA) 10 MG tablet TAKE 1 TABLET BY MOUTH EVERY DAY   Multiple Vitamin (MULTIVITAMIN) tablet Take 1 tablet by mouth daily.   Omega-3 Fatty Acids (FISH OIL) 1000 MG CAPS Take 1 capsule by mouth daily.     Allergies:   Patient has no known allergies.  Social History   Tobacco Use   Smoking status: Never   Smokeless tobacco: Never  Substance Use Topics   Alcohol use: No   Drug use: No     Family Hx: The patient's family history includes Cataracts in his father; Heart attack in his mother; Macular degeneration in his mother.  ROS:   Please see the history of present illness.     All other systems reviewed and are negative.   Labs/Other Tests and Data Reviewed:    Recent Labs: No results found for requested labs within last 365 days.   Recent Lipid Panel Lab Results  Component Value Date/Time   CHOL 135 06/09/2021 01:18 PM   TRIG 126 06/09/2021 01:18 PM   HDL 47 06/09/2021 01:18 PM   CHOLHDL 2.9 06/09/2021 01:18 PM   LDLCALC 66 06/09/2021 01:18 PM    Wt Readings from Last 3  Encounters:  08/08/24 232 lb 6.4 oz (105.4 kg)  08/04/23 229 lb 9.6 oz (104.1 kg)  05/12/22 226 lb 6.4 oz (102.7 kg)     Objective:    Vital Signs:  BP 134/68   Pulse (!) 55   Ht 5' 10 (1.778 m)   Wt 232 lb 6.4 oz (105.4 kg)   SpO2 99%   BMI 33.35 kg/m    GEN: Well nourished, well developed in no acute distress HEENT: Normal NECK: No JVD; No carotid bruits LYMPHATICS: No lymphadenopathy CARDIAC:RRR, no murmurs, rubs, gallops RESPIRATORY:  Clear to auscultation without rales, wheezing or rhonchi  ABDOMEN: Soft, non-tender, non-distended MUSCULOSKELETAL:  No edema; No deformity  SKIN: Warm and dry NEUROLOGIC:  Alert and oriented x 3 PSYCHIATRIC:  Normal affect  ASSESSMENT & PLAN:    OSA - The patient is tolerating PAP therapy well without any problems. The PAP download performed by his DME was personally reviewed and interpreted by me today and showed an AHI of 0.8 /hr on auto CPAP from 4-18 cm H2O with 93% compliance in using more than 4 hours nightly.  The patient has been using and benefiting from PAP use and will continue to benefit from therapy.   HTN -BP adequately controlled on exam today at 134/68 mmHg -continue Amlodipine  5mg  daily and Bisoprolol 10mg  daily with PRN refills  Palpitations/PVCs/NSVT/PACs -event monitor in 2019 showed PVCs and PACs and NSVT -cardiac MRI and ETT were normal -he occasionally has some palpitations -continue Bisolprolol 10mg  daily with PRN refills  Coronary artery calcifications -Coronary calcium  score 05/22/2021 was markedly elevated at 1921 which was 90th percentile for age, gender and race matched controls. -Exercise treadmill test showed no evidence of ischemia 04-2021 -He has not had any anginal sx -since it has been several years since last stress test and given high cor cal score>>recommend repeat GXT to rule out ischemia -Informed Consent   Shared Decision Making/Informed Consent The risks [chest pain, shortness of breath,  cardiac arrhythmias, dizziness, blood pressure fluctuations, myocardial infarction, stroke/transient ischemic attack, and life-threatening complications (estimated to be 1 in 10,000)], benefits (risk stratification, diagnosing coronary artery disease, treatment guidance) and alternatives of an exercise tolerance test were discussed in detail with Allen Whitney and he agrees to proceed. -continue ASA 81mg  daily and Atorvastatin  20mg  daily with PRN refills  Carotid artery stenosis -Dopplers 10/2020 showed bilateral 1 to 39% stenosis  -Dopplers 11/14/2022 showed no evidence of carotid stenosis  Hyperlipidemia -LDL goal < 170 -I have personally reviewed and interpreted outside labs performed by patient's PCP which showed LDL 64, HDL 36 and ALT  23 on 03/10/2024 -continue Atorvastatin  20mg  daily with PRN refills  Medication Adjustments/Labs and Tests Ordered: Current medicines are reviewed at length with the patient today.  Concerns regarding medicines are outlined above.  Tests Ordered: Orders Placed This Encounter  Procedures   EKG 12-Lead    Medication Changes: No orders of the defined types were placed in this encounter.    Disposition:  Follow up in 1 year(s)  Signed, Wilbert Bihari, MD  08/08/2024 9:23 AM    [facility]

## 2024-08-08 ENCOUNTER — Encounter: Payer: Self-pay | Admitting: Cardiology

## 2024-08-08 ENCOUNTER — Ambulatory Visit: Attending: Cardiology | Admitting: Cardiology

## 2024-08-08 VITALS — BP 134/68 | HR 55 | Ht 70.0 in | Wt 232.4 lb

## 2024-08-08 DIAGNOSIS — I4729 Other ventricular tachycardia: Secondary | ICD-10-CM | POA: Diagnosis not present

## 2024-08-08 DIAGNOSIS — I10 Essential (primary) hypertension: Secondary | ICD-10-CM | POA: Diagnosis not present

## 2024-08-08 DIAGNOSIS — G4733 Obstructive sleep apnea (adult) (pediatric): Secondary | ICD-10-CM | POA: Diagnosis not present

## 2024-08-08 DIAGNOSIS — I2510 Atherosclerotic heart disease of native coronary artery without angina pectoris: Secondary | ICD-10-CM

## 2024-08-08 DIAGNOSIS — R931 Abnormal findings on diagnostic imaging of heart and coronary circulation: Secondary | ICD-10-CM

## 2024-08-08 DIAGNOSIS — E785 Hyperlipidemia, unspecified: Secondary | ICD-10-CM

## 2024-08-08 DIAGNOSIS — I493 Ventricular premature depolarization: Secondary | ICD-10-CM | POA: Diagnosis not present

## 2024-08-08 NOTE — Addendum Note (Signed)
 Addended by: VICCI ROXIE CROME on: 08/08/2024 09:39 AM   Modules accepted: Orders

## 2024-08-08 NOTE — Patient Instructions (Signed)
 Medication Instructions:  Your physician recommends that you continue on your current medications as directed. Please refer to the Current Medication list given to you today.  *If you need a refill on your cardiac medications before your next appointment, please call your pharmacy*  Testing/Procedures: Your physician has requested that you have an exercise tolerance test. For further information please visit [email or web address] Please also follow instruction sheet, as given.   Follow-Up: At [facility], you and your health needs are our priority.  As part of our continuing mission to provide you with exceptional heart care, our providers are all part of one team.  This team includes your primary Cardiologist (physician) and Advanced Practice Providers or APPs (Physician Assistants and Nurse Practitioners) who all work together to provide you with the care you need, when you need it.  Your next appointment:   1 year(s)  Provider:   Wilbert Bihari, MD  We recommend signing up for the patient portal called MyChart.  Sign up information is provided on this After Visit Summary.  MyChart is used to connect with patients for Virtual Visits (Telemedicine).  Patients are able to view lab/test results, encounter notes, upcoming appointments, etc.  Non-urgent messages can be sent to your provider as well.   To learn more about what you can do with MyChart, go to [email or web address].   Other Instructions Exercise Tolerance Test  Please arrive 15 minutes prior to your appointment time for registration and insurance purposes.  The test will take approximately 45 minutes to complete.  How to prepare for your Exercise Stress Test: Do bring a list of your current medications with you.  If not listed below, you may take your medications as normal. Do wear comfortable clothes (no dresses or overalls) and walking shoes, tennis shoes preferred (no heels or open toed shoes are allowed) Do Not  wear cologne, perfume, aftershave or lotions (deodorant is allowed). Please report to [address] for your test.  If these instructions are not followed, your test will have to be rescheduled.  If you have questions or concerns about your appointment, you can call the Stress Lab at [phone].  If you cannot keep your appointment, please provide 24 hours notification to the Stress Lab, to avoid a possible $50 charge to your account.

## 2024-08-13 ENCOUNTER — Other Ambulatory Visit: Payer: Self-pay | Admitting: Cardiology

## 2024-08-24 ENCOUNTER — Encounter (HOSPITAL_COMMUNITY): Payer: Self-pay | Admitting: *Deleted

## 2024-09-01 ENCOUNTER — Ambulatory Visit (HOSPITAL_COMMUNITY)
Admission: RE | Admit: 2024-09-01 | Discharge: 2024-09-01 | Disposition: A | Discharge status: Home / Self Care | Source: Ambulatory Visit | Attending: Internal Medicine | Admitting: Internal Medicine

## 2024-09-01 DIAGNOSIS — R931 Abnormal findings on diagnostic imaging of heart and coronary circulation: Secondary | ICD-10-CM | POA: Diagnosis not present

## 2024-09-01 DIAGNOSIS — I2510 Atherosclerotic heart disease of native coronary artery without angina pectoris: Secondary | ICD-10-CM | POA: Diagnosis not present

## 2024-09-01 LAB — EXERCISE TOLERANCE TEST
Angina Index: 0
Duke Treadmill Score: 8
Estimated workload: 9.9
Exercise duration (min): 8 min
Exercise duration (sec): 0 s
MPHR: 156 {beats}/min
Peak HR: 113 {beats}/min
Percent HR: 72 %
RPE: 17
Rest HR: 64 {beats}/min
ST Depression (mm): 0 mm

## 2024-09-02 ENCOUNTER — Other Ambulatory Visit: Payer: Self-pay | Admitting: Cardiology

## 2024-09-07 ENCOUNTER — Telehealth: Payer: Self-pay

## 2024-09-07 DIAGNOSIS — I2510 Atherosclerotic heart disease of native coronary artery without angina pectoris: Secondary | ICD-10-CM

## 2024-09-07 NOTE — Telephone Encounter (Signed)
 Call to patient to discuss ETT results, no answer. Left detailed VM per DPR asking patient to call our office to discuss setting up lexiscan stress test.

## 2024-09-08 NOTE — Telephone Encounter (Signed)
 Patient is returning call. Please advise?

## 2024-09-13 ENCOUNTER — Encounter: Payer: Self-pay | Admitting: Cardiology

## 2024-09-14 NOTE — Telephone Encounter (Signed)
 Call to patient to provide information and preparation instructions for stress test. Patient verbalizes understanding that chemical stress test is recommended since he was unable to tolerate exercise treadmill test and agrees to proceed with Stress/PET/CT. Written instruction sent  over [facility], order placed.

## 2024-09-14 NOTE — Addendum Note (Signed)
 Addended by: JANIT GENI CROME on: 09/14/2024 03:37 PM   Modules accepted: Orders

## 2024-09-14 NOTE — Addendum Note (Signed)
 Addended by: SHLOMO WILBERT SAUNDERS on: 09/14/2024 03:40 PM   Modules accepted: Orders

## 2024-09-28 DIAGNOSIS — H353212 Exudative age-related macular degeneration, right eye, with inactive choroidal neovascularization: Secondary | ICD-10-CM | POA: Diagnosis not present

## 2024-10-09 ENCOUNTER — Encounter (HOSPITAL_COMMUNITY): Payer: Self-pay

## 2024-10-11 ENCOUNTER — Inpatient Hospital Stay (HOSPITAL_COMMUNITY): Admission: RE | Admit: 2024-10-11 | Discharge: 2024-10-11 | Attending: Cardiology | Admitting: Cardiology

## 2024-10-11 DIAGNOSIS — I2510 Atherosclerotic heart disease of native coronary artery without angina pectoris: Secondary | ICD-10-CM | POA: Diagnosis not present

## 2024-10-11 DIAGNOSIS — G4733 Obstructive sleep apnea (adult) (pediatric): Secondary | ICD-10-CM | POA: Diagnosis not present

## 2024-10-11 MED ORDER — RUBIDIUM RB82 GENERATOR (RUBYFILL)
27.7700 | PACK | Freq: Once | INTRAVENOUS | Status: AC
  Administered 2024-10-11: 07:00:00 27.77 via INTRAVENOUS

## 2024-10-11 MED ORDER — REGADENOSON 0.4 MG/5ML IV SOLN
0.4000 mg | Freq: Once | INTRAVENOUS | Status: AC
  Administered 2024-10-11: 07:00:00 0.4 mg via INTRAVENOUS

## 2024-10-11 MED ORDER — RUBIDIUM RB82 GENERATOR (RUBYFILL)
27.7100 | PACK | Freq: Once | INTRAVENOUS | Status: AC
  Administered 2024-10-11: 07:00:00 27.71 via INTRAVENOUS

## 2024-10-11 MED ORDER — REGADENOSON 0.4 MG/5ML IV SOLN
INTRAVENOUS | Status: AC
  Filled 2024-10-11: qty 5

## 2024-10-12 ENCOUNTER — Ambulatory Visit: Payer: Self-pay | Admitting: Cardiology

## 2024-10-12 LAB — NM PET CT CARDIAC PERFUSION MULTI W/ABSOLUTE BLOODFLOW
MBFR: 1.94
Nuc Rest EF: 52 %
Nuc Stress EF: 68 %
Rest MBF: 0.63 ml/g/min
Rest Nuclear Isotope Dose: 27.7 mCi
ST Depression (mm): 0 mm
Stress MBF: 1.22 ml/g/min
Stress Nuclear Isotope Dose: 27.8 mCi
TID: 0.93

## 2024-10-16 NOTE — Telephone Encounter (Signed)
 Call to patient to discuss stress test results. Patient verbalizes understanding of normal blood flow with no ischemia and normal LVF. Explained that myocardial blood flow reserve is reduced that is most c/w microvascular disease and not consistent with obstructive disease of the large epicardial vessels. Patient does endorse that he does have chest tightness, fatigue and SOB on exertion which has worsened since his ETT in 2022 but he states he thought he was just out of shape. Patient responses forwarded to Dr. Shlomo.

## 2024-10-16 NOTE — Telephone Encounter (Signed)
-----   Message from Wilbert Bihari, MD sent at 10/13/2024  4:41 PM EST ----- Please have patient let us  know if he develops any CP, SOB or exercise intolerance

## 2024-10-20 MED ORDER — ISOSORBIDE MONONITRATE ER 30 MG PO TB24: 30.0000 mg | ORAL_TABLET | Freq: Every day | ORAL | 3 refills | Status: AC

## 2024-10-20 NOTE — Telephone Encounter (Signed)
-----   Message from Wilbert Bihari, MD sent at 10/16/2024  8:10 PM EST ----- Please add Imdur  30mg  daily and followup with extender in 3 weeks to reassess CP that is likely microvascular in origin ----- Message ----- From: Janit Geni CROME, RN Sent: 10/16/2024  10:37 AM EST To: Wilbert JONELLE Bihari, MD  ----- Message from Geni CROME Janit, RN sent at 10/16/2024 10:37 AM EST -----  Please see patient responses. ----- Message ----- From: Bihari Wilbert JONELLE, MD Sent: 10/13/2024   4:41 PM EST To: Geni CROME Janit, RN  Please have patient let us  know if he develops any CP, SOB or exercise intolerance

## 2024-10-20 NOTE — Telephone Encounter (Signed)
 Call to patient to discuss Dr. Dorine recommendation to add Imdur  30mg  daily and followup with extender in 3 weeks to reassess CP that is likely microvascular in origin. Patient verbalizes understanding and agrees to plan, orders placed. F/u scheduled for 11/10/23.

## 2024-10-20 NOTE — Addendum Note (Signed)
 Addended by: JANIT GENI CROME on: 10/20/2024 04:26 PM   Modules accepted: Orders

## 2024-10-22 ENCOUNTER — Other Ambulatory Visit: Payer: Self-pay | Admitting: Cardiology

## 2024-11-07 NOTE — Progress Notes (Unsigned)
 " Cardiology Office Note   Date: 11/09/2024  ID:  Allen Whitney Jul 01, 1961 [id number] PCP: Allen Carlin Redbird, MD  [facility] Providers Cardiologist: None Sleep Medicine:  Allen Bihari, MD     Chief Complaint: Allen Whitney is a 64 y.o.male with PMH of coronary calcifications on PET stress 09/2024, hypertension, PVCs, PACs, OSA on CPAP who presents to the clinic for three-week follow-up.    Mr. Allen Whitney established care with Dr. Bihari in 2016 for management of sleep apnea. He was followed annually until 10/2017 when he noted atypical chest pain. ETT was ordered and completed 11/30/2017 showing no ST changes with stress, but did note significant hypertension to 214/69. Cardiac monitor at same time showed predominantly NSR with occasional PACs, PVCs, and wide complex tachycardia up to 11 beats. Bisoprolol was started. He was scheduled for cardiac MRI 01/25/2018 which showed LVEF 65%, no evidence of hypertrophic cardiomyopathy, ischemia, or infiltrative disease. He was referred to pharmD for titration of anti-hypertensive medications.   He had an echo 10/2020 showing LVEF 60-65%, mild LVH, G2DD. ETT for chest pain 04/2021 low risk. 05/22/2021 had coronary calcium  score of 1921 (98th percentile). He was asymptomatic and has been followed annually.  Office visit 07/2024 he was having increased DOE, LE edema, and palpitations. Subsequent ETT 09/01/2024 inconclusive. PET stress 10/11/2024 low risk for ischemia. However, the test did show reduced myocardial blood flow reserve, consistent with microvascular disease. Coronary calcifications in the LAD, LCx, and RCA were also noted. He was started on Imdur  for DOE and chest tightness and was scheduled for close follow-up.    History of Present Illness: Today he tells me that his symptoms of chest tightness have improved significantly with the addition of Imdur . He has palpitations but they are also significantly improved from prior. He has chronic LE  edema that is worst at the end of the day after being on his feet all day. He does wear compression stockings. He is trying to improve his health and walked on his treadmill for 25 minutes a few days ago. He did have dyspnea with this but he attributes this to deconditioning. He denies chest pain. We discussed findings on his new [name] which include HR, respiratory rate, sleep patterns, and ECGs. He is wearing his CPAP nightly.  ROS: Denies anginal chest pain, increased shortness of breath, orthopnea, PND, dizziness, syncope.   Studies Reviewed: The following studies were reviewed today: Cardiac Studies & Procedures   ______________________________________________________________________________________________   STRESS TESTS  NM PET CT CARDIAC PERFUSION MULTI W/ABSOLUTE BLOODFLOW 10/11/2024  Narrative   Normal perfusion.  Myocardial blood flow reserve is mildly reduced (1.9).  No other high risk findings such as TID or drop in EF with stress.  Severe coronary calcifications on CT.  Decreased myocardial blood flow reserve could be due to severe multivessel obstructive CAD or microvascular disease; given MBFR only mildly reduced and no other high risk findings, suspect more likely microvascular disease   LV perfusion is normal. There is no evidence of ischemia. There is no evidence of infarction.   Rest left ventricular function is normal. Rest EF: 52%. Stress left ventricular function is normal. Stress EF: 68%. End diastolic cavity size is normal. End systolic cavity size is normal.   Myocardial blood flow was computed to be 0.53ml/g/min at rest and 1.22ml/g/min at stress. Global myocardial blood flow reserve was 1.94 and was mildly abnormal.   Coronary calcium  was present on the attenuation correction CT images. Severe coronary calcifications were present.  Coronary calcifications were present in the left anterior descending artery, left circumflex artery and right coronary artery  distribution(s).   Findings are consistent with no ischemia. The study is low risk.   Electronically signed by Allen Nanas, MD  CLINICAL DATA:  This over-read does not include interpretation of cardiac or coronary anatomy or pathology. The interpretation by the cardiologist is attached.  COMPARISON:  CT chest 02/20/2014.  FINDINGS: Scout view is grossly unremarkable. Cholecystectomy. Degenerative changes in the spine. Image quality is degraded by respiratory motion. 5 mm superior segment right lower lobe nodule (4/14), present dating back to 02/20/2014. Per Fleischner Society guidelines, no follow-up is necessary.  IMPRESSION: No acute extracardiac findings.   Electronically Signed By: Allen Whitney M.D. On: 10/11/2024 08:43   ECHOCARDIOGRAM  ECHOCARDIOGRAM COMPLETE 11/08/2020  Narrative ECHOCARDIOGRAM REPORT    Patient Name:   Allen Whitney Date of Exam: 11/08/2020 Medical Rec #:  [id number]       Height:       70.5 in Accession #:    [id number]      Weight:       223.0 lb Date of Birth:  1961/08/06      BSA:          2.198 m Patient Age:    59 years        BP:           136/84 mmHg Patient Gender: M               HR:           73 bpm. Exam Location:  [address]  Procedure: 2D Echo, Cardiac Doppler, Color Doppler and Intracardiac Opacification Agent  Indications:    H35.82 Retinal ischemia  History:        Patient has no prior history of Echocardiogram examinations. Risk Factors:Hypertension. Acute pulmonary embolism. Obstructive sleep apnea (on CPAP).  Sonographer:    Allen Whitney RCS Referring Phys: [phone] Allen Whitney  IMPRESSIONS   1. Left ventricular ejection fraction, by estimation, is 60 to 65%. The left ventricle has normal function. The left ventricle has no regional wall motion abnormalities. There is mild concentric left ventricular hypertrophy. Left ventricular diastolic parameters are consistent with Grade II diastolic  dysfunction (pseudonormalization). Elevated left ventricular end-diastolic pressure. 2. Right ventricular systolic function is normal. The right ventricular size is normal. 3. The mitral valve is normal in structure. Trivial mitral valve regurgitation. No evidence of mitral stenosis. 4. The aortic valve is tricuspid. Aortic valve regurgitation is not visualized. No aortic stenosis is present. 5. The inferior vena cava is normal in size with greater than 50% respiratory variability, suggesting right atrial pressure of 3 mmHg.  FINDINGS Left Ventricle: Left ventricular ejection fraction, by estimation, is 60 to 65%. The left ventricle has normal function. The left ventricle has no regional wall motion abnormalities. The left ventricular internal cavity size was normal in size. There is mild concentric left ventricular hypertrophy. Left ventricular diastolic parameters are consistent with Grade II diastolic dysfunction (pseudonormalization). Elevated left ventricular end-diastolic pressure.  Right Ventricle: The right ventricular size is normal. No increase in right ventricular wall thickness. Right ventricular systolic function is normal.  Left Atrium: Left atrial size was normal in size.  Right Atrium: Right atrial size was normal in size.  Pericardium: There is no evidence of pericardial effusion.  Mitral Valve: The mitral valve is normal in structure. Trivial mitral valve regurgitation. No evidence of mitral valve stenosis.  Tricuspid Valve:  The tricuspid valve is normal in structure. Tricuspid valve regurgitation is not demonstrated. No evidence of tricuspid stenosis.  Aortic Valve: The aortic valve is tricuspid. Aortic valve regurgitation is not visualized. No aortic stenosis is present.  Pulmonic Valve: The pulmonic valve was normal in structure. Pulmonic valve regurgitation is not visualized. No evidence of pulmonic stenosis.  Aorta: The aortic root is normal in size and  structure.  Venous: The inferior vena cava is normal in size with greater than 50% respiratory variability, suggesting right atrial pressure of 3 mmHg.  IAS/Shunts: No atrial level shunt detected by color flow Doppler.   LEFT VENTRICLE PLAX 2D LVIDd:         4.30 cm  Diastology LVIDs:         2.20 cm  LV e' medial:    6.85 cm/s LV PW:         1.10 cm  LV E/e' medial:  13.3 LV IVS:        1.20 cm  LV e' lateral:   8.05 cm/s LVOT diam:     1.95 cm  LV E/e' lateral: 11.3 LV SV:         82 LV SV Index:   38 LVOT Area:     2.99 cm   RIGHT VENTRICLE RV Basal diam:  2.00 cm RV S prime:     15.70 cm/s TAPSE (M-mode): 2.3 cm  LEFT ATRIUM             Index       RIGHT ATRIUM           Index LA diam:        4.30 cm 1.96 cm/m  RA Area:     12.30 cm LA Vol (A2C):   45.0 ml 20.48 ml/m RA Volume:   26.30 ml  11.97 ml/m LA Vol (A4C):   51.4 ml 23.39 ml/m LA Biplane Vol: 47.6 ml 21.66 ml/m AORTIC VALVE LVOT Vmax:   112.00 cm/s LVOT Vmean:  72.700 cm/s LVOT VTI:    0.276 m  AORTA Ao Root diam: 3.30 cm  MITRAL VALVE MV Area (PHT): 3.79 cm    SHUNTS MV Decel Time: 200 msec    Systemic VTI:  0.28 m MV E velocity: 91.30 cm/s  Systemic Diam: 1.95 cm MV A velocity: 76.70 cm/s MV E/A ratio:  1.19  Annabella Scarce MD Electronically signed by Annabella Scarce MD Signature Date/Time: 11/08/2020/2:34:30 PM    Final    MONITORS  CARDIAC EVENT MONITOR 11/30/2017  Narrative  Normal sinus rhythm with average heart rate  Occsaional PACs and atrial couplets, nonsustained atrial tachycardia up to 5 beats  Occasional PVCS and wide complex tachycardia up to 11 beats   CT SCANS  CT CARDIAC SCORING (SELF PAY ONLY) 05/22/2021  Addendum 05/22/2021  4:26 PM ADDENDUM REPORT: 05/22/2021 16:24  CLINICAL DATA:  Risk stratification: 64 Year-old White Male  EXAM: Coronary Calcium  Score  TECHNIQUE: The patient was scanned on a [employer] scanner. Axial non-contrast 3 mm slices  were carried out through the heart. The data set was analyzed on a dedicated work station and scored using the Agatson method.  FINDINGS: Non-cardiac: See separate report from [facility].  Ascending Aorta: Normal caliber.  Aortic atherosclerosis.  Pericardium: Normal.  Coronary arteries: Normal origins.  Coronary Calcium  Score:  Left main: 277  Left anterior descending artery: 813  Left circumflex artery: 188  Right coronary artery: 643  Total: 1921  Percentile: 98th for age, sex, and  race matched control.  IMPRESSION: 1. Coronary calcium  score of 1921. This was 98th percentile for age, gender, and race matched controls.  2.  Aortic atherosclerosis.  RECOMMENDATIONS:  Coronary artery calcium  (CAC) score is a strong predictor of incident coronary heart disease (CHD) and provides predictive information beyond traditional risk factors. CAC scoring is reasonable to use in the decision to withhold, postpone, or initiate statin therapy in intermediate-risk or selected borderline-risk asymptomatic adults (age 5-75 years and LDL-C >=70 to <190 mg/dL) who do not have diabetes or established atherosclerotic cardiovascular disease (ASCVD).* In intermediate-risk (10-year ASCVD risk >=7.5% to <20%) adults or selected borderline-risk (10-year ASCVD risk >=5% to <7.5%) adults in whom a CAC score is measured for the purpose of making a treatment decision the following recommendations have been made:  If CAC = 0, it is reasonable to withhold statin therapy and reassess in 5 to 10 years, as long as higher risk conditions are absent (diabetes mellitus, family history of premature CHD in first degree relatives (males <55 years; females <65 years), cigarette smoking, LDL >=190 mg/dL or other independent risk factors).  If CAC is 1 to 99, it is reasonable to initiate statin therapy for patients >=68 years of age.  If CAC is >=100 or >=75th percentile, it is reasonable to  initiate statin therapy at any age.  Cardiology referral should be considered for patients with CAC scores =400 or >=75th percentile.  *2018 AHA/ACC/AACVPR/AAPA/ABC/ACPM/ADA/AGS/APhA/ASPC/NLA/PCNA Guideline on the Management of Blood Cholesterol: A Report of the American College of Cardiology/American Heart Association Task Force on Clinical Practice Guidelines. J Am Coll Cardiol. 2019;73(24):3168-3209.  Stanly Leavens, MD   Electronically Signed By: Stanly Leavens MD On: 05/22/2021 16:24  Narrative EXAM: OVER-READ INTERPRETATION  CT CHEST  The following report is an over-read performed by radiologist Dr. Rockey Kilts of [facility] on 05/22/2021. This over-read does not include interpretation of cardiac or coronary anatomy or pathology. The calcium  score interpretation by the cardiologist is attached.  COMPARISON:  04/27/2015 chest CT  FINDINGS: Vascular: Aortic atherosclerosis.  Tortuous thoracic aorta.  Mediastinum/Nodes: No mediastinal or definite hilar adenopathy, No imaged thoracic adenopathy. Fluid level in the esophagus on 15/4.  Lungs/Pleura: No pleural fluid. 2 mm superior segment right lower lobe pulmonary nodule on 03/03 is not readily apparent on the prior.  A subpleural right lower lobe 4 mm pulmonary nodule on 11/03 is present in 2016 and considered benign.  Upper Abdomen: Caudate lobe prominence. Normal imaged portions of the spleen, stomach.  Musculoskeletal: Moderate thoracic spondylosis.  IMPRESSION: 1.  No acute findings in the imaged extracardiac chest. 2.  Aortic Atherosclerosis (ICD10-I70.0). 3. Right lower lobe pulmonary 2 mm nodule is not readily apparent on the prior. No follow-up needed if patient is low-risk. Non-contrast chest CT can be considered in 12 months if patient is high-risk. This recommendation follows the consensus statement: Guidelines for Management of Incidental Pulmonary Nodules Detected on CT  Images: From the Fleischner Society 2017; Radiology 2017; 284:228-243. 4. Nonspecific caudate lobe prominence. Correlate with risk factors for liver disease or mild cirrhosis. 5. Esophageal air fluid level suggests dysmotility or gastroesophageal reflux.  Electronically Signed: By: Rockey Kilts M.D. On: 05/22/2021 14:45   CARDIAC MRI  MR CARDIAC MORPHOLOGY W WO CONTRAST 01/25/2018  Narrative CLINICAL DATA:  NSVT, assess for ARVC  EXAM: CARDIAC MRI  TECHNIQUE: The patient was scanned on a 1.5 Tesla GE magnet. A dedicated cardiac coil was used. Functional imaging was done using Fiesta sequences. 2,3,  and 4 chamber views were done to assess for RWMA's. Modified Simpson's rule using a short axis stack was used to calculate an ejection fraction on a dedicated work [employer]. The patient received 33 cc of Multihance . After 10 minutes inversion recovery sequences were used to assess for infiltration and scar tissue.  CONTRAST:  33 cc Multihance   FINDINGS: Limited images of the lung fields showed no gross abnormalities.  Normal left ventricular size and wall thickness. Normal wall motion, EF 65%. Visually, the right ventricle was mildly dilated but by volume calculation within normal limits. Right ventricle had normal wall motion and normal systolic function, EF 58%. Borderline left atrial enlargement. Normal right atrial size. Trileaflet aortic valve with no stenosis or regurgitation. No significant mitral regurgitation.  Delayed enhancement imaging showed no myocardial late gadolinium enhancement (LGE).  Measurements:  LVEDV 123 mL  LVSV 80 mL  LVEF 65%  RVEDV 139 mL  RVSV 81 mL  RVEF 58%  IMPRESSION: 1. Normal LV size and systolic function, EF 65%. No evidence for hypertrophic cardiomyopathy.  2. Normal RV size and wall motion/systolic function, EF 58%. No evidence for ARVC.  3. No myocardial LGE, so no definitive evidence for  prior myocarditis, infiltrative disease, or MI.  Dalton Mclean   Electronically Signed By: Ezra Shuck M.D. On: 01/26/2018 08:38   ______________________________________________________________________________________________                        Physical Exam: VS: BP 126/64 (BP Location: Left Arm, Patient Position: Sitting, Cuff Size: Large)   Pulse 66   Ht 5' 10 (1.778 m)   Wt 245 lb (111.1 kg)   SpO2 95%   BMI 35.15 kg/m   GEN:  Well nourished, in NAD HEENT: Normal NECK: No JVD, no carotid bruits LYMPHATICS: No lymphadenopathy CARDIAC: RRR, no murmurs, rubs, gallops RESPIRATORY: Clear to auscultation bilaterally ABDOMEN: Soft, non-tender, non-distended MUSCULOSKELETAL: No edema, no deformity  SKIN: Warm and dry NEUROLOGIC:  Alert and oriented x 3 PSYCHIATRIC:  Pleasant, normal affect   Assessment & Plan: 1. Coronary calcifications: Calcifications but no blockages noted on PET stress test 09/2024. This test also showed reduced myocardial blood flow reserve, consistent with microvascular disease. He was started on Imdur  by Dr. Shlomo. His symptoms have improved significantly with the addition of this medication. Should he have progressive symptoms, consider escalation of antianginal therapy. - Continue aspirin  81 mg daily - Continue bisoprolol 10 mg daily - Continue atorvastatin  20 mg daily - Continue Imdur  30 mg and amlodipine  10 mg daily  2. Hypertension: BP today 126/64, slightly lower than home readings. - Encouraged him to bring his BP cuff in to the clinic to assess for accuracy - Continue amlodipine  10 mg daily  - Continue bisoprolol 10 mg daily - Continue Imdur  30 mg daily  3. Hyperlipidemia: Lipid panel by PCP 03/10/2024 showed LDL 64, HDL 36, TGs 108, total 120. - Recommended high-fiber low-fat diet and increasing his physical activity as tolerated - Continue atorvastatin  20 mg daily  4. PVCs/PACs: Noted on monitor 11/2017. EKGs on [name]  without ectopy. Does still note some palpitations, but these are improved. - Continue bisoprolol 10 mg daily  5. OSA: Reports compliance with CPAP. Follows with Dr. Shlomo. - Recommended continued compliance    Dispo: Follow-up in 6 months with Dr. Shlomo.  Signed, Saddie GORMAN Cleaves, NP 11/09/2024 4:33 PM [facility] "

## 2024-11-08 ENCOUNTER — Encounter: Payer: Self-pay | Admitting: Cardiology

## 2024-11-09 ENCOUNTER — Ambulatory Visit: Attending: Nurse Practitioner

## 2024-11-09 ENCOUNTER — Encounter: Payer: Self-pay | Admitting: Nurse Practitioner

## 2024-11-09 VITALS — BP 126/64 | HR 66 | Ht 70.0 in | Wt 245.0 lb

## 2024-11-09 DIAGNOSIS — I2510 Atherosclerotic heart disease of native coronary artery without angina pectoris: Secondary | ICD-10-CM

## 2024-11-09 DIAGNOSIS — I10 Essential (primary) hypertension: Secondary | ICD-10-CM | POA: Diagnosis not present

## 2024-11-09 DIAGNOSIS — E782 Mixed hyperlipidemia: Secondary | ICD-10-CM

## 2024-11-09 DIAGNOSIS — G4733 Obstructive sleep apnea (adult) (pediatric): Secondary | ICD-10-CM | POA: Diagnosis not present

## 2024-11-09 NOTE — Patient Instructions (Signed)
 Medication Instructions:  Your physician recommends that you continue on your current medications as directed. Please refer to the Current Medication list given to you today.  *If you need a refill on your cardiac medications before your next appointment, please call your pharmacy*  Lab Work: NONE ordered at this time of appointment   Testing/Procedures: NONE ordered at this time of appointment   Follow-Up: At [facility], you and your health needs are our priority.  As part of our continuing mission to provide you with exceptional heart care, our providers are all part of one team.  This team includes your primary Cardiologist (physician) and Advanced Practice Providers or APPs (Physician Assistants and Nurse Practitioners) who all work together to provide you with the care you need, when you need it.  Your next appointment:   6 month(s)  Provider:   Dr. Shlomo  We recommend signing up for the patient portal called MyChart.  Sign up information is provided on this After Visit Summary.  MyChart is used to connect with patients for Virtual Visits (Telemedicine).  Patients are able to view lab/test results, encounter notes, upcoming appointments, etc.  Non-urgent messages can be sent to your provider as well.   To learn more about what you can do with MyChart, go to [email or web address].
